# Patient Record
Sex: Female | Born: 1990 | Race: Black or African American | Hispanic: No | Marital: Single | Smoking: Never smoker
Health system: Southern US, Community
[De-identification: ages and names within clinical notes are randomized; demographics above are authoritative.]

## PROBLEM LIST (undated history)

## (undated) ENCOUNTER — Inpatient Hospital Stay (HOSPITAL_COMMUNITY): Payer: Self-pay

## (undated) DIAGNOSIS — Z8759 Personal history of other complications of pregnancy, childbirth and the puerperium: Secondary | ICD-10-CM

## (undated) HISTORY — PX: NO PAST SURGERIES: SHX2092

---

## 2013-09-20 ENCOUNTER — Emergency Department (INDEPENDENT_AMBULATORY_CARE_PROVIDER_SITE_OTHER)
Admission: EM | Admit: 2013-09-20 | Discharge: 2013-09-20 | Disposition: A | Discharge status: Home / Self Care | Payer: Self-pay | Source: Home / Self Care | Attending: Emergency Medicine | Admitting: Emergency Medicine

## 2013-09-20 ENCOUNTER — Encounter (HOSPITAL_COMMUNITY): Payer: Self-pay | Admitting: Emergency Medicine

## 2013-09-20 DIAGNOSIS — S39012A Strain of muscle, fascia and tendon of lower back, initial encounter: Secondary | ICD-10-CM

## 2013-09-20 DIAGNOSIS — S335XXA Sprain of ligaments of lumbar spine, initial encounter: Secondary | ICD-10-CM

## 2013-09-20 MED ORDER — METHOCARBAMOL 500 MG PO TABS: 500.0000 mg | ORAL_TABLET | Freq: Three times a day (TID) | ORAL | Status: DC

## 2013-09-20 MED ORDER — TRAMADOL HCL 50 MG PO TABS: 100.0000 mg | ORAL_TABLET | Freq: Three times a day (TID) | ORAL | Status: DC | PRN

## 2013-09-20 MED ORDER — MELOXICAM 15 MG PO TABS: 15.0000 mg | ORAL_TABLET | Freq: Every day | ORAL | Status: DC
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
## 2013-09-20 NOTE — Discharge Instructions (Signed)
Do exercises twice daily followed by moist heat for 15 minutes. ° ° ° ° ° °Try to be as active as possible. ° °If no better in 2 weeks, follow up with orthopedist. ° ° °

## 2014-03-12 ENCOUNTER — Ambulatory Visit (INDEPENDENT_AMBULATORY_CARE_PROVIDER_SITE_OTHER): Payer: PRIVATE HEALTH INSURANCE | Admitting: Family Medicine

## 2014-03-12 VITALS — BP 100/70 | HR 89 | Temp 98.0°F | Resp 16 | Ht 65.75 in | Wt 100.2 lb

## 2014-03-12 DIAGNOSIS — H1133 Conjunctival hemorrhage, bilateral: Secondary | ICD-10-CM

## 2014-03-12 DIAGNOSIS — H1130 Conjunctival hemorrhage, unspecified eye: Secondary | ICD-10-CM

## 2014-03-12 NOTE — Patient Instructions (Signed)
Subconjunctival Hemorrhage °A subconjunctival hemorrhage is a bright red patch covering a portion of the white of the eye. The white part of the eye is called the sclera, and it is covered by a thin membrane called the conjunctiva. This membrane is clear, except for tiny blood vessels that you can see with the naked eye. When your eye is irritated or inflamed and becomes red, it is because the vessels in the conjunctiva are swollen. °Sometimes, a blood vessel in the conjunctiva can break and bleed. When this occurs, the blood builds up between the conjunctiva and the sclera, and spreads out to create a red area. The red spot may be very small at first. It may then spread to cover a larger part of the surface of the eye, or even all of the visible white part of the eye. °In almost all cases, the blood will go away and the eye will become white again. Before completely dissolving, however, the red area may spread. It may also become brownish-yellow in color, before going away. If a lot of blood collects under the conjunctiva, it may look like a bulge on the surface of the eye. This looks scary, but it will also eventually flatten out and go away. Subconjunctival hemorrhages do not cause pain, but if swollen, may cause a feeling of irritation. There is no effect on vision.  °CAUSES  °· The most common cause is mild trauma (rubbing the eye, irritation). °· Subconjunctival hemorrhages can happen because of coughing or straining (lifting heavy objects), vomiting, or sneezing. °· In some cases, your doctor may want to check your blood pressure. High blood pressure can also cause a sunconjunctival hemorrhage. °· Severe trauma or blunt injuries. °· Diseases that affect blood clotting (hemophilia, leukemia). °· Abnormalities of blood vessels behind the eye (carotid cavernous sinus fistula). °· Tumors behind the eye. °· Certain drugs (aspirin, coumadin, heparin). °· Recent eye surgery. °HOME CARE INSTRUCTIONS  °· Do not worry  about the appearance of your eye. You may continue your usual activities. °· Often, follow-up is not necessary. °SEEK MEDICAL CARE IF:  °· Your eye becomes painful. °· The bleeding does not disappear within 3 weeks. °· Bleeding occurs elsewhere, for example, under the skin, in the mouth, or in the other eye. °· You have recurring subconjunctival hemorrhages. °SEEK IMMEDIATE MEDICAL CARE IF:  °· Your vision changes or you have difficulty seeing. °· You develop severe headache, persistent vomiting, confusion, or abnormal drowsiness (lethargy). °· Your eye seems to bulge or protrude from the eye socket. °· You notice the sudden appearance of bruises, or have spontaneous bleeding elsewhere on your body. °Document Released: 08/10/2005 Document Revised: 11/02/2011 Document Reviewed: 07/08/2009 °ExitCare® Patient Information ©2015 [employer]. This information is not intended to replace advice given to you by your health care provider. Make sure you discuss any questions you have with your health care provider. ° °

## 2014-03-12 NOTE — Progress Notes (Signed)
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 She developed some conjunctival hemorrhages in both eyes.  Patient has no change in vision, no itching. She does not use contacts  Objective: No acute distress Fundi normal Patient has bilateral small subconjunctival hemorrhages medial to each of her irises. Pupils are equal and reactive  Assessment: Bilateral subconjunctival hemorrhage after Valsalva.  Plan: Reassurance  Signed, Adin Lariccia M.D.

## 2015-07-25 ENCOUNTER — Encounter (HOSPITAL_COMMUNITY): Payer: Self-pay

## 2015-07-25 ENCOUNTER — Inpatient Hospital Stay (HOSPITAL_COMMUNITY)
Admission: AD | Admit: 2015-07-25 | Discharge: 2015-07-26 | Disposition: A | Discharge status: Home / Self Care | Payer: BLUE CROSS/BLUE SHIELD | Source: Ambulatory Visit | Attending: Family Medicine | Admitting: Family Medicine

## 2015-07-25 ENCOUNTER — Inpatient Hospital Stay (HOSPITAL_COMMUNITY): Payer: BLUE CROSS/BLUE SHIELD

## 2015-07-25 DIAGNOSIS — O209 Hemorrhage in early pregnancy, unspecified: Secondary | ICD-10-CM | POA: Diagnosis present

## 2015-07-25 DIAGNOSIS — O4691 Antepartum hemorrhage, unspecified, first trimester: Secondary | ICD-10-CM

## 2015-07-25 DIAGNOSIS — O3680X0 Pregnancy with inconclusive fetal viability, not applicable or unspecified: Secondary | ICD-10-CM

## 2015-07-25 DIAGNOSIS — O0281 Inappropriate change in quantitative human chorionic gonadotropin (hCG) in early pregnancy: Secondary | ICD-10-CM | POA: Diagnosis not present

## 2015-07-25 LAB — URINALYSIS, ROUTINE W REFLEX MICROSCOPIC
Glucose, UA: NEGATIVE mg/dL
Ketones, ur: NEGATIVE mg/dL
Leukocytes, UA: NEGATIVE
NITRITE: NEGATIVE
Protein, ur: 30 mg/dL — AB
Specific Gravity, Urine: >1.03 — ABNORMAL HIGH (ref 1.005–1.030)
pH: 5.5 (ref 5.0–8.0)

## 2015-07-25 LAB — CBC
HEMATOCRIT: 37.9 % (ref 36.0–46.0)
Hemoglobin: 12.6 g/dL (ref 12.0–15.0)
MCH: 29.6 pg (ref 26.0–34.0)
MCHC: 33.2 g/dL (ref 30.0–36.0)
MCV: 89.2 fL (ref 78.0–100.0)
Platelets: 341 10*3/uL (ref 150–400)
RBC: 4.25 MIL/uL (ref 3.87–5.11)
RDW: 13.3 % (ref 11.5–15.5)
WBC: 4.8 10*3/uL (ref 4.0–10.5)

## 2015-07-25 LAB — WET PREP, GENITAL
CLUE CELLS WET PREP: NONE SEEN
Sperm: NONE SEEN
TRICH WET PREP: NONE SEEN
YEAST WET PREP: NONE SEEN

## 2015-07-25 LAB — HCG, QUANTITATIVE, PREGNANCY: HCG, BETA CHAIN, QUANT, S: 614 m[IU]/mL — AB (ref <5)

## 2015-07-25 LAB — URINE MICROSCOPIC-ADD ON: WBC, UA: NONE SEEN WBC/hpf (ref 0–5)

## 2015-07-25 LAB — POCT PREGNANCY, URINE: PREG TEST UR: POSITIVE — AB

## 2015-07-25 NOTE — MAU Note (Signed)
Pt took pregnancy test in November-was positive. Started cramping and having vaginal bleeding that started 1 week ago-more when she wipes. LMP: 06/09/2015.

## 2015-07-25 NOTE — Discharge Instructions (Signed)

## 2015-07-25 NOTE — MAU Provider Note (Signed)
History     CSN: [id number]  Arrival date and time: 07/25/15 2216   First Provider Initiated Contact with Patient 07/25/15 2314      Chief Complaint  Patient presents with  . Abdominal Cramping  . Vaginal Bleeding   Vaginal Bleeding The patient's primary symptoms include vaginal bleeding. This is a new problem. The current episode started 1 to 4 weeks ago. The problem occurs constantly. The problem has been gradually worsening. The pain is moderate (4/10). The problem affects both sides. She is pregnant. Associated symptoms include abdominal pain. Pertinent negatives include no chills, constipation, diarrhea, dysuria, fever, frequency, nausea, urgency or vomiting. The vaginal discharge was bloody. The vaginal bleeding is lighter than menses. She has not been passing clots. She has not been passing tissue. Nothing aggravates the symptoms. She has tried nothing for the symptoms. She is sexually active. It is unknown whether or not her partner has an STD.     History reviewed. No pertinent past medical history.  History reviewed. No pertinent past surgical history.  Family History  Problem Relation Age of Onset  . Cancer Maternal Grandmother   . Diabetes Maternal Grandmother     Social History  Substance Use Topics  . Smoking status: Never Smoker   . Smokeless tobacco: Never Used  . Alcohol Use: No    Allergies: No Known Allergies  Prescriptions prior to admission  Medication Sig Dispense Refill Last Dose  . ethynodiol-ethinyl estradiol (KELNOR,ZOVIA) 1-35 MG-MCG tablet Take 1 tablet by mouth daily.   Taking    Review of Systems  Constitutional: Negative for fever and chills.  Gastrointestinal: Positive for abdominal pain. Negative for nausea, vomiting, diarrhea and constipation.  Genitourinary: Positive for vaginal bleeding. Negative for dysuria, urgency and frequency.   Physical Exam   Blood pressure 119/70, pulse 99, temperature 98.3 F (36.8 C), temperature source  Oral, resp. rate 18, height  (1.651 m), weight 46.811 kg (103 lb 3.2 oz), last menstrual period 06/09/2015, SpO2 100 %.  Physical Exam  Nursing note and vitals reviewed. Constitutional: She is oriented to person, place, and time. She appears well-developed and well-nourished. No distress.  HENT:  Head: Normocephalic.  Cardiovascular: Normal rate.   Respiratory: Effort normal.  GI: Soft. There is no tenderness. There is no rebound.  Neurological: She is alert and oriented to person, place, and time.  Skin: Skin is warm and dry.  Psychiatric: She has a normal mood and affect.   Results for orders placed or performed during the hospital encounter of 07/25/15 (from the past 24 hour(s))  Urinalysis, Routine w reflex microscopic (not at [facility])     Status: Abnormal   Collection Time: 07/25/15 10:28 PM  Result Value Ref Range   Color, Urine RED (A) YELLOW   APPearance HAZY (A) CLEAR   Specific Gravity, Urine >1.030 (H) 1.005 - 1.030   pH 5.5 5.0 - 8.0   Glucose, UA NEGATIVE NEGATIVE mg/dL   Hgb urine dipstick LARGE (A) NEGATIVE   Bilirubin Urine SMALL (A) NEGATIVE   Ketones, ur NEGATIVE NEGATIVE mg/dL   Protein, ur 30 (A) NEGATIVE mg/dL   Nitrite NEGATIVE NEGATIVE   Leukocytes, UA NEGATIVE NEGATIVE  Urine microscopic-add on     Status: Abnormal   Collection Time: 07/25/15 10:28 PM  Result Value Ref Range   Squamous Epithelial / LPF 0-5 (A) NONE SEEN   WBC, UA NONE SEEN 0 - 5 WBC/hpf   RBC / HPF TOO NUMEROUS TO COUNT 0 - 5 RBC/hpf  Bacteria, UA RARE (A) NONE SEEN   Urine-Other MUCOUS PRESENT   Pregnancy, urine POC     Status: Abnormal   Collection Time: 07/25/15 10:37 PM  Result Value Ref Range   Preg Test, Ur POSITIVE (A) NEGATIVE  CBC     Status: None   Collection Time: 07/25/15 10:49 PM  Result Value Ref Range   WBC 4.8 4.0 - 10.5 K/uL   RBC 4.25 3.87 - 5.11 MIL/uL   Hemoglobin 12.6 12.0 - 15.0 g/dL   HCT 16.137.9 09.636.0 - 04.546.0 %   MCV 89.2 78.0 - 100.0 fL   MCH 29.6 26.0 -  34.0 pg   MCHC 33.2 30.0 - 36.0 g/dL   RDW 40.913.3 81.111.5 - 91.415.5 %   Platelets 341 150 - 400 K/uL  ABO/Rh     Status: None (Preliminary result)   Collection Time: 07/25/15 10:49 PM  Result Value Ref Range   ABO/RH(D) B POS   hCG, quantitative, pregnancy     Status: Abnormal   Collection Time: 07/25/15 10:49 PM  Result Value Ref Range   hCG, Beta Chain, Quant, S 614 (H) <5 mIU/mL  Wet prep, genital     Status: Abnormal   Collection Time: 07/25/15 11:05 PM  Result Value Ref Range   Yeast Wet Prep HPF POC NONE SEEN NONE SEEN   Trich, Wet Prep NONE SEEN NONE SEEN   Clue Cells Wet Prep HPF POC NONE SEEN NONE SEEN   WBC, Wet Prep HPF POC FEW (A) NONE SEEN   Sperm NONE SEEN    [place] Ob Comp Less 14 Wks  07/25/2015  CLINICAL DATA:  Cramping and bleeding for 1 week EXAM: OBSTETRIC <14 WK US AND TRANSVAGINAL OB US TECHNIQUE: Both transabdominal and transvaginal ultrasound examinations were performed for complete evaluation of the gestation as well as the maternal uterus, adnexal regions, and pelvic cul-de-sac. Transvaginal technique was performed to assess early pregnancy. COMPARISON:  None. FINDINGS: Intrauterine gestational sac: None Yolk sac:  No Embryo:  No Cardiac Activity: No Heart Rate:   bpm MSD:   mm    w     d CRL:    mm    w    d                  US EDC: Maternal uterus/adnexae: Normal ovaries. Mildly complex fluid or blood in the cul-de-sac. IMPRESSION: No visible gestational sac. Small volume blood or fluid in the cul-de-sac. No adnexal mass or cyst. The findings are nonspecific and could represent early pregnancy failure, intrauterine gestation which is too small to visualize, ectopic gestation. Electronically Signed   By: Ellery Plunkaniel R Mitchell M.D.   On: 07/25/2015 23:52   [place] Ob Transvaginal  07/25/2015  CLINICAL DATA:  Cramping and bleeding for 1 week EXAM: OBSTETRIC <14 WK US AND TRANSVAGINAL OB US TECHNIQUE: Both transabdominal and transvaginal ultrasound examinations were performed for complete  evaluation of the gestation as well as the maternal uterus, adnexal regions, and pelvic cul-de-sac. Transvaginal technique was performed to assess early pregnancy. COMPARISON:  None. FINDINGS: Intrauterine gestational sac: None Yolk sac:  No Embryo:  No Cardiac Activity: No Heart Rate:   bpm MSD:   mm    w     d CRL:    mm    w    d                  US EDC: Maternal uterus/adnexae: Normal ovaries. Mildly complex fluid or blood  in the cul-de-sac. IMPRESSION: No visible gestational sac. Small volume blood or fluid in the cul-de-sac. No adnexal mass or cyst. The findings are nonspecific and could represent early pregnancy failure, intrauterine gestation which is too small to visualize, ectopic gestation. Electronically Signed   By: Ellery Plunk M.D.   On: 07/25/2015 23:52     MAU Course  Procedures  MDM  Assessment and Plan   1. Pregnancy of unknown anatomic location   2. Vaginal bleeding in pregnancy, first trimester    DC home Comfort measures reviewed  1st Trimester precautions  Bleeding precautions Ectopic precautions RX: none  Return to MAU in 48 hours   Follow-up Information    Follow up with THE [facility] [place] MATERNITY ADMISSIONS.   Why:  07/27/15 in the evening    Contact information:   [address] [id number] mc [place] [address] [phone]        Tawnya Crook 07/25/2015, 11:15 PM

## 2015-07-26 DIAGNOSIS — O209 Hemorrhage in early pregnancy, unspecified: Secondary | ICD-10-CM | POA: Diagnosis not present

## 2015-07-26 LAB — ABO/RH: ABO/RH(D): B POS

## 2015-07-26 LAB — RPR: RPR Ser Ql: NONREACTIVE

## 2015-07-26 LAB — GC/CHLAMYDIA PROBE AMP (~~LOC~~) NOT AT ARMC
Chlamydia: NEGATIVE
NEISSERIA GONORRHEA: NEGATIVE

## 2015-07-26 LAB — HIV ANTIBODY (ROUTINE TESTING W REFLEX): HIV Screen 4th Generation wRfx: NONREACTIVE

## 2015-07-27 ENCOUNTER — Inpatient Hospital Stay (HOSPITAL_COMMUNITY)
Admission: AD | Admit: 2015-07-27 | Discharge: 2015-07-27 | Disposition: A | Discharge status: Home / Self Care | Payer: BLUE CROSS/BLUE SHIELD | Source: Ambulatory Visit | Attending: Obstetrics & Gynecology | Admitting: Obstetrics & Gynecology

## 2015-07-27 DIAGNOSIS — O0281 Inappropriate change in quantitative human chorionic gonadotropin (hCG) in early pregnancy: Secondary | ICD-10-CM

## 2015-07-27 DIAGNOSIS — O3680X0 Pregnancy with inconclusive fetal viability, not applicable or unspecified: Secondary | ICD-10-CM

## 2015-07-27 LAB — HCG, QUANTITATIVE, PREGNANCY: HCG, BETA CHAIN, QUANT, S: 635 m[IU]/mL — AB (ref <5)

## 2015-07-27 NOTE — MAU Note (Signed)
Here for follow up labs. Denies pain and states vag bleeding has stopped

## 2015-07-27 NOTE — MAU Provider Note (Signed)
  History     CSN: [id number]  Arrival date and time: 07/27/15 2114   None     Chief Complaint  Patient presents with  . Follow-up   HPI Misty Mckenzie 24 y.o. G1P0 Pt presents for follow up lab work related to ectopic workup started 2 days ago.  She denies vaginal bleeding or abdominal pain since that time.   OB History    Gravida Para Term Preterm AB TAB SAB Ectopic Multiple Living   1               No past medical history on file.  No past surgical history on file.  Family History  Problem Relation Age of Onset  . Cancer Maternal Grandmother   . Diabetes Maternal Grandmother     Social History  Substance Use Topics  . Smoking status: Never Smoker   . Smokeless tobacco: Never Used  . Alcohol Use: No    Allergies: No Known Allergies  No prescriptions prior to admission    ROS Pertinent ROS in HPI.  All other systems are negative.   Physical Exam   Blood pressure 127/78, pulse 79, temperature 98.4 F (36.9 C), resp. rate 18, height 5\' 4"  (1.626 m), weight 105 lb (47.628 kg), last menstrual period 06/09/2015.  Physical Exam  Constitutional: She is oriented to person, place, and time. She appears well-developed and well-nourished. No distress.  HENT:  Head: Normocephalic and atraumatic.  Eyes: Conjunctivae and EOM are normal.  Respiratory: Effort normal. No respiratory distress.  Neurological: She is alert and oriented to person, place, and time.  Psychiatric: She has a normal mood and affect. Her behavior is normal.    MAU Course  Procedures  MDM HCG 2 days ago: 614 HCG today: 635  Assessment and Plan  A:  1. Pregnancy of unknown anatomic location   2. Inappropriate change in quantitative hCG in early pregnancy    P: Discharge to home Too early to call miscarriage.  Pt desiring pregnancy.   Reasonable expectations addressed Pt to return to MAU in 48 hours for repeat quant HCG Ectopic precautions discussed. Come right away to MAU for heavy  bleeding, severe pain/etc Patient may return to MAU as needed or if her condition were to change or worsen   Bertram DenverKaren E Teague Clark 07/27/2015, 10:21 PM

## 2015-07-27 NOTE — MAU Note (Signed)
Misty MaclachlanKaren Teague-Clark PA in Triage to discuss test results and d/c plan. D/C home from Triage. Will return in 2 days for repeat labs

## 2015-07-29 ENCOUNTER — Inpatient Hospital Stay (HOSPITAL_COMMUNITY)
Admission: AD | Admit: 2015-07-29 | Discharge: 2015-07-30 | Disposition: A | Discharge status: Home / Self Care | Payer: BLUE CROSS/BLUE SHIELD | Source: Ambulatory Visit | Attending: Obstetrics & Gynecology | Admitting: Obstetrics & Gynecology

## 2015-07-29 ENCOUNTER — Other Ambulatory Visit: Payer: Self-pay | Admitting: Obstetrics and Gynecology

## 2015-07-29 DIAGNOSIS — O209 Hemorrhage in early pregnancy, unspecified: Secondary | ICD-10-CM | POA: Diagnosis present

## 2015-07-29 DIAGNOSIS — O3680X0 Pregnancy with inconclusive fetal viability, not applicable or unspecified: Secondary | ICD-10-CM

## 2015-07-29 DIAGNOSIS — O0281 Inappropriate change in quantitative human chorionic gonadotropin (hCG) in early pregnancy: Secondary | ICD-10-CM | POA: Diagnosis not present

## 2015-07-29 DIAGNOSIS — O26851 Spotting complicating pregnancy, first trimester: Secondary | ICD-10-CM | POA: Insufficient documentation

## 2015-07-29 DIAGNOSIS — Z3A01 Less than 8 weeks gestation of pregnancy: Secondary | ICD-10-CM | POA: Insufficient documentation

## 2015-07-29 LAB — HCG, QUANTITATIVE, PREGNANCY: hCG, Beta Chain, Quant, S: 941 m[IU]/mL — ABNORMAL HIGH (ref <5)

## 2015-07-29 NOTE — MAU Note (Signed)
PT  SAYS  SHE  WAS HERE   ON  SAT-     .   ON  SAT- NO PAIN -  NOW  NO PAIN.       ON  SAT -  SPOTTING.   NOW   LIGHT BROWN  SPOTTING  ON PAD.

## 2015-07-30 ENCOUNTER — Inpatient Hospital Stay (HOSPITAL_COMMUNITY): Payer: BLUE CROSS/BLUE SHIELD

## 2015-07-30 DIAGNOSIS — O0281 Inappropriate change in quantitative human chorionic gonadotropin (hCG) in early pregnancy: Secondary | ICD-10-CM | POA: Diagnosis not present

## 2015-07-30 NOTE — Discharge Instructions (Signed)

## 2015-07-30 NOTE — MAU Provider Note (Signed)
History     CSN: [id number]  Arrival date and time: 07/29/15 2246   First Provider Initiated Contact with Patient 07/29/15 2351      No chief complaint on file.  HPI Comments: Misty Mckenzie is a 24 y.o. G1P0 at 9260w2d who presents today for FU HCG. She has been followed with HCGs that were not rising as expected. She denies any abdominal pain today. She had spotting on Saturday, and today has had brown spotting. She states that the bleeding is less than Saturday.   Vaginal Bleeding The patient's primary symptoms include vaginal discharge. This is a new problem. The current episode started in the past 7 days. The problem occurs intermittently. The problem has been unchanged. The patient is experiencing no pain. She is pregnant. The vaginal discharge was brown. The vaginal bleeding is spotting. She has not been passing clots. She has not been passing tissue. Nothing aggravates the symptoms. She has tried nothing for the symptoms.    No past medical history on file.  No past surgical history on file.  Family History  Problem Relation Age of Onset  . Cancer Maternal Grandmother   . Diabetes Maternal Grandmother     Social History  Substance Use Topics  . Smoking status: Never Smoker   . Smokeless tobacco: Never Used  . Alcohol Use: No    Allergies: No Known Allergies  No prescriptions prior to admission    Review of Systems  Genitourinary: Positive for vaginal bleeding and vaginal discharge.   Physical Exam   Blood pressure 122/79, pulse 101, temperature 98.4 F (36.9 C), temperature source Oral, resp. rate 20, last menstrual period 06/09/2015.  Physical Exam  Nursing note and vitals reviewed. Constitutional: She is oriented to person, place, and time. She appears well-developed and well-nourished. No distress.  HENT:  Head: Normocephalic.  Cardiovascular: Normal rate.   Respiratory: Effort normal.  GI: Soft. There is no tenderness.  Neurological: She is alert and  oriented to person, place, and time.  Skin: Skin is warm and dry.  Psychiatric: She has a normal mood and affect.    Results for Misty LoftyHILLIPS, Brynlei (MRN [id number]) as of 07/30/2015 00:53  Ref. Range 07/25/2015 22:49 07/25/2015 23:05 07/25/2015 23:39 07/27/2015 21:25 07/29/2015 22:55  HCG, Beta Chain, Quant, S Latest Ref Range: <5 mIU/mL 614 (H)   635 (H) 941 (H)   Results for orders placed or performed during the hospital encounter of 07/29/15 (from the past 24 hour(s))  hCG, quantitative, pregnancy     Status: Abnormal   Collection Time: 07/29/15 10:55 PM  Result Value Ref Range   hCG, Beta Chain, Quant, S 941 (H) <5 mIU/mL   [place] Ob Transvaginal  07/30/2015  CLINICAL DATA:  24 year old female with positive pregnancy test EXAM: OBSTETRIC <14 WK US AND TRANSVAGINAL OB US TECHNIQUE: Transvaginal ultrasound examinations were performed for evaluation of the gestation as well as the maternal uterus, adnexal regions, and pelvic cul-de-sac. Transvaginal technique was performed to assess early pregnancy. COMPARISON:  Ultrasound dated 07/25/2015 FINDINGS: The uterus is anteverted appears unremarkable. No intrauterine pregnancy identified. The gestational age based on LMP is 7 weeks, 2 days. Findings likely represent spontaneous abortion. However, the possibility of an ectopic pregnancy is not entirely excluded. Correlation with serial HCG levels and follow-up ultrasound is recommended. The right ovary measures 2.5 x 2.7 x 1.9 cm and the left ovary measures 4.0 x 2.4 cm. A a cystic structure within the left ovary most compatible with a corpus luteum. No significant  free fluid the pelvis. IMPRESSION: No intrauterine pregnancy identified. Correlation with clinical exam and follow-up with serial HCG levels and ultrasound recommended. Electronically Signed   By: Elgie Collard M.D.   On: 07/30/2015 00:49    MAU Course  Procedures  MDM 0058: D/W Dr. Debroah Loop. Repeat HCG IN 48 hours.    Assessment and Plan   1.  Pregnancy, location unknown    DC home Comfort measures reviewed  1st Trimester precautions  Bleeding precautions Ectopic precautions RX: none  Return to MAU as needed FU with OB as planned  Follow-up Information    Follow up with THE [facility] [place] MATERNITY ADMISSIONS In 2 days.   Why:  FOR REPEAT BLOOD WORK    Contact information:   [address] [id number] mc [place] [address] [phone]        Tawnya Crook 07/30/2015, 12:54 AM

## 2015-08-01 ENCOUNTER — Inpatient Hospital Stay (HOSPITAL_COMMUNITY)
Admission: AD | Admit: 2015-08-01 | Discharge: 2015-08-01 | Disposition: A | Discharge status: Home / Self Care | Payer: BLUE CROSS/BLUE SHIELD | Source: Ambulatory Visit | Attending: Obstetrics and Gynecology | Admitting: Obstetrics and Gynecology

## 2015-08-01 DIAGNOSIS — O009 Unspecified ectopic pregnancy without intrauterine pregnancy: Secondary | ICD-10-CM | POA: Diagnosis present

## 2015-08-01 LAB — COMPREHENSIVE METABOLIC PANEL
ALBUMIN: 4.1 g/dL (ref 3.5–5.0)
ALK PHOS: 51 U/L (ref 38–126)
ALT: 11 U/L — AB (ref 14–54)
AST: 20 U/L (ref 15–41)
Anion gap: 7 (ref 5–15)
BILIRUBIN TOTAL: 0.8 mg/dL (ref 0.3–1.2)
BUN: 14 mg/dL (ref 6–20)
CALCIUM: 8.7 mg/dL — AB (ref 8.9–10.3)
CO2: 26 mmol/L (ref 22–32)
CREATININE: 0.65 mg/dL (ref 0.44–1.00)
Chloride: 104 mmol/L (ref 101–111)
GFR calc Af Amer: >60 mL/min (ref >60)
GFR calc non Af Amer: >60 mL/min (ref >60)
Glucose, Bld: 89 mg/dL (ref 65–99)
Potassium: 3.7 mmol/L (ref 3.5–5.1)
SODIUM: 137 mmol/L (ref 135–145)
TOTAL PROTEIN: 7.7 g/dL (ref 6.5–8.1)

## 2015-08-01 LAB — CBC
HEMATOCRIT: 35.7 % — AB (ref 36.0–46.0)
HEMOGLOBIN: 11.9 g/dL — AB (ref 12.0–15.0)
MCH: 29.4 pg (ref 26.0–34.0)
MCHC: 33.3 g/dL (ref 30.0–36.0)
MCV: 88.1 fL (ref 78.0–100.0)
Platelets: 336 10*3/uL (ref 150–400)
RBC: 4.05 MIL/uL (ref 3.87–5.11)
RDW: 13 % (ref 11.5–15.5)
WBC: 3.9 10*3/uL — ABNORMAL LOW (ref 4.0–10.5)

## 2015-08-01 LAB — HCG, QUANTITATIVE, PREGNANCY: HCG, BETA CHAIN, QUANT, S: 961 m[IU]/mL — AB (ref <5)

## 2015-08-01 MED ORDER — METHOTREXATE INJECTION FOR WOMEN'S HOSPITAL
50.0000 mg/m2 | Freq: Once | INTRAMUSCULAR | Status: AC
  Administered 2015-08-01: 75 mg via INTRAMUSCULAR
  Filled 2015-08-01: qty 1.5

## 2015-08-01 NOTE — MAU Note (Signed)
Pt her for f/u HHCG. Denies andy bleeding had some cramping last  Night none this morning.

## 2015-08-01 NOTE — MAU Provider Note (Signed)
History   Chief Complaint:  Follow-up   Misty Mckenzie is  24 y.o. G1P0 Patient's last menstrual period was 06/09/2015.Marland Kitchen. Patient is here for follow up of quantitative HCG and ongoing surveillance of pregnancy status.   She is 759w4d weeks gestation  by LMP.    Since her last visit, the patient is with new complaint.     Pt her for f/u HHCG. Denies andy bleeding had some cramping last Night none this morning  ROS Abdomin Pain: None now. Mild cramping last night. Vaginal bleeding: none now.   Passage of clots or tissue: None Dizziness: None  Her previous Quantitative HCG values are:  Results for Misty Mckenzie (MRN [id number]) as of 08/01/2015 11:32  Ref. Range 07/25/2015 22:49 07/27/2015 21:25 07/29/2015 22:55  HCG, Beta Chain, Quant, S Latest Ref Range: <5 mIU/mL 614 (H) 635 (H) 941 (H)    Physical Exam   BP 123/75 mmHg  Pulse 91  Temp(Src) 97.7 F (36.5 C)  Resp 18  LMP 06/09/2015 Constitutional: Well-nourished female in no apparent distress. No pallor Neuro: Alert and oriented 4 Cardiovascular: Normal rate Respiratory: Normal effort and rate Abdomen: Soft, nontender Gynecological Exam: examination not indicated  Labs: Results for orders placed or performed during the hospital encounter of 08/01/15 (from the past 24 hour(s))  hCG, quantitative, pregnancy   Collection Time: 08/01/15  8:27 AM  Result Value Ref Range   hCG, Beta Chain, Quant, S 961 (H) <5 mIU/mL    Ultrasound Studies:   [place] Ob Comp Less 14 Wks  07/25/2015  CLINICAL DATA:  Cramping and bleeding for 1 week EXAM: OBSTETRIC <14 WK US AND TRANSVAGINAL OB US TECHNIQUE: Both transabdominal and transvaginal ultrasound examinations were performed for complete evaluation of the gestation as well as the maternal uterus, adnexal regions, and pelvic cul-de-sac. Transvaginal technique was performed to assess early pregnancy. COMPARISON:  None. FINDINGS: Intrauterine gestational sac: None Yolk sac:  No Embryo:  No Cardiac  Activity: No Heart Rate:   bpm MSD:   mm    w     d CRL:    mm    w    d                  US EDC: Maternal uterus/adnexae: Normal ovaries. Mildly complex fluid or blood in the cul-de-sac. IMPRESSION: No visible gestational sac. Small volume blood or fluid in the cul-de-sac. No adnexal mass or cyst. The findings are nonspecific and could represent early pregnancy failure, intrauterine gestation which is too small to visualize, ectopic gestation. Electronically Signed   By: Ellery Plunkaniel R Mitchell M.D.   On: 07/25/2015 23:52   [place] Ob Transvaginal  07/30/2015  CLINICAL DATA:  24 year old female with positive pregnancy test EXAM: OBSTETRIC <14 WK US AND TRANSVAGINAL OB US TECHNIQUE: Transvaginal ultrasound examinations were performed for evaluation of the gestation as well as the maternal uterus, adnexal regions, and pelvic cul-de-sac. Transvaginal technique was performed to assess early pregnancy. COMPARISON:  Ultrasound dated 07/25/2015 FINDINGS: The uterus is anteverted appears unremarkable. No intrauterine pregnancy identified. The gestational age based on LMP is 7 weeks, 2 days. Findings likely represent spontaneous abortion. However, the possibility of an ectopic pregnancy is not entirely excluded. Correlation with serial HCG levels and follow-up ultrasound is recommended. The right ovary measures 2.5 x 2.7 x 1.9 cm and the left ovary measures 4.0 x 2.4 cm. A a cystic structure within the left ovary most compatible with a corpus luteum. No significant free fluid the pelvis. IMPRESSION:  No intrauterine pregnancy identified. Correlation with clinical exam and follow-up with serial HCG levels and ultrasound recommended. Electronically Signed   By: Elgie Collard M.D.   On: 07/30/2015 00:49   US Ob Transvaginal  07/25/2015  CLINICAL DATA:  Cramping and bleeding for 1 week EXAM: OBSTETRIC <14 WK [place] AND TRANSVAGINAL OB US TECHNIQUE: Both transabdominal and transvaginal ultrasound examinations were performed for  complete evaluation of the gestation as well as the maternal uterus, adnexal regions, and pelvic cul-de-sac. Transvaginal technique was performed to assess early pregnancy. COMPARISON:  None. FINDINGS: Intrauterine gestational sac: None Yolk sac:  No Embryo:  No Cardiac Activity: No Heart Rate:   bpm MSD:   mm    w     d CRL:    mm    w    d                  [place] EDC: Maternal uterus/adnexae: Normal ovaries. Mildly complex fluid or blood in the cul-de-sac. IMPRESSION: No visible gestational sac. Small volume blood or fluid in the cul-de-sac. No adnexal mass or cyst. The findings are nonspecific and could represent early pregnancy failure, intrauterine gestation which is too small to visualize, ectopic gestation. Electronically Signed   By: Ellery Plunk M.D.   On: 07/25/2015 23:52    MAU course/MDM: Quantitative hCG ordered  Pain and bleeding in early pregnancy with abnormal rise in Quant 3, but hemodynamically stable. Suspect ectopic pregnancy. Low suspicion for rupture due to absence of pain and no significant free fluid on ultrasound 2 days ago. Discussed diagnosis with patient and recommend methotrexate. Patient agrees. CMP and CBC ordered. Methotrexate given.  Assessment: Ectopic pregnancy treated with methotrexate.  Plan: Discharge home in stable condition. Ectopic precautions Instructed to avoid folic acid supplements or NSAIDs 1 week. Support given. Pregnancy loss information given Follow-up Information    Follow up with [facility] On 08/04/2015.   Why:  For repeat blood work or sooner as needed for worsening abdominal pain.   Contact information:   [address] [id number] mc [place] [address] [phone]        Medication List    Notice    You have not been prescribed any medications.      Dorathy Kinsman, CNM 08/01/2015, 10:08 AM  2/3

## 2015-08-01 NOTE — Discharge Instructions (Signed)
Ectopic Pregnancy °An ectopic pregnancy is when the fertilized egg attaches (implants) outside the uterus. Most ectopic pregnancies occur in the fallopian tube. Rarely do ectopic pregnancies occur on the ovary, intestine, pelvis, or cervix. In an ectopic pregnancy, the fertilized egg does not have the ability to develop into a normal, healthy baby.  °A ruptured ectopic pregnancy is one in which the fallopian tube gets torn or bursts and results in internal bleeding. Often there is intense abdominal pain, and sometimes, vaginal bleeding. Having an ectopic pregnancy can be life threatening. If left untreated, this dangerous condition can lead to a blood transfusion, abdominal surgery, or even death. °CAUSES  °Damage to the fallopian tubes is the suspected cause in most ectopic pregnancies.  °RISK FACTORS °Depending on your circumstances, the risk of having an ectopic pregnancy will vary. The level of risk can be divided into three categories. °High Risk °· You have gone through infertility treatment. °· You have had a previous ectopic pregnancy. °· You have had previous tubal surgery. °· You have had previous surgery to have the fallopian tubes tied (tubal ligation). °· You have tubal problems or diseases. °· You have been exposed to DES. DES is a medicine that was used until 1971 and had effects on babies whose mothers took the medicine. °· You become pregnant while using an intrauterine device (IUD) for birth control.  °Moderate Risk °· You have a history of infertility. °· You have a history of a sexually transmitted infection (STI). °· You have a history of pelvic inflammatory disease (PID). °· You have scarring from endometriosis. °· You have multiple sexual partners. °· You smoke.  °Low Risk °· You have had previous pelvic surgery. °· You use vaginal douching. °· You became sexually active before 23 years of age. °SIGNS AND SYMPTOMS  °An ectopic pregnancy should be suspected in anyone who has missed a period and  has abdominal pain or bleeding. °· You may experience normal pregnancy symptoms, such as: °· Nausea. °· Tiredness. °· Breast tenderness. °· Other symptoms may include: °· Pain with intercourse. °· Irregular vaginal bleeding or spotting. °· Cramping or pain on one side or in the lower abdomen. °· Fast heartbeat. °· Passing out while having a bowel movement. °· Symptoms of a ruptured ectopic pregnancy and internal bleeding may include: °· Sudden, severe pain in the abdomen and pelvis. °· Dizziness or fainting. °· Pain in the shoulder area. °DIAGNOSIS  °Tests that may be performed include: °· A pregnancy test. °· An ultrasound test. °· Testing the specific level of pregnancy hormone in the bloodstream. °· Taking a sample of uterus tissue (dilation and curettage, D&C). °· Surgery to perform a visual exam of the inside of the abdomen using a thin, lighted tube with a tiny camera on the end (laparoscope). °TREATMENT  °An injection of a medicine called methotrexate may be given. This medicine causes the pregnancy tissue to be absorbed. It is given if: °· The diagnosis is made early. °· The fallopian tube has not ruptured. °· You are considered to be a good candidate for the medicine. °Usually, pregnancy hormone blood levels are checked after methotrexate treatment. This is to be sure the medicine is effective. It may take 4-6 weeks for the pregnancy to be absorbed (though most pregnancies will be absorbed by 3 weeks). °Surgical treatment may be needed. A laparoscope may be used to remove the pregnancy tissue. If severe internal bleeding occurs, a cut (incision) may be made in the lower abdomen (laparotomy), and the ectopic   pregnancy is removed. This stops the bleeding. Part of the fallopian tube, or the whole tube, may be removed as well (salpingectomy). After surgery, pregnancy hormone tests may be done to be sure there is no pregnancy tissue left. You may receive a Rho (D) immune globulin shot if you are Rh negative and  the father is Rh positive, or if you do not know the Rh type of the father. This is to prevent problems with any future pregnancy. °SEEK IMMEDIATE MEDICAL CARE IF:  °You have any symptoms of an ectopic pregnancy. This is a medical emergency. °MAKE SURE YOU: °· Understand these instructions. °· Will watch your condition. °· Will get help right away if you are not doing well or get worse. °  °This information is not intended to replace advice given to you by your health care provider. Make sure you discuss any questions you have with your health care provider. °  °Document Released: 09/17/2004 Document Revised: 08/31/2014 Document Reviewed: 03/09/2013 °Elsevier Interactive Patient Education ©2016 [employer] Inc[employer] ° °Methotrexate Treatment for an Ectopic Pregnancy, Care After °Refer to this sheet in the next few weeks. These instructions provide you with information on caring for yourself after your procedure. Your health care provider may also give you more specific instructions. Your treatment has been planned according to current medical practices, but problems sometimes occur. Call your health care provider if you have any problems or questions after your procedure. °WHAT TO EXPECT AFTER THE PROCEDURE °You may have some abdominal cramping, vaginal bleeding, and fatigue in the first few days after taking methotrexate. Some other possible side effects of methotrexate include: °· Nausea. °· Vomiting. °· Diarrhea. °· Mouth sores. °· Swelling or irritation of the lining of your lungs (pneumonitis). °· Liver damage. °· Hair loss. °HOME CARE INSTRUCTIONS  °After you have received the methotrexate medicine, you need to be careful of your activities and watch your condition for several weeks. It may take 1 week before your hormone levels return to normal. °· Keep all follow-up appointments as directed by your health care provider. °· Avoid traveling too far away from your health care provider. °· Do not have sexual intercourse  until your health care provider says it is safe to do so. °· You may resume your usual diet. °· Limit strenuous activity. °· Do not take folic acid, prenatal vitamins, or other vitamins that contain folic acid. °· Do not take aspirin, ibuprofen, or naproxen (nonsteroidal anti-inflammatory drugs [NSAIDs]). °· Do not drink alcohol. °SEEK MEDICAL CARE IF:  °· You cannot control your nausea and vomiting. °· You cannot control your diarrhea. °· You have sores in your mouth and want treatment. °· You need pain medicine for your abdominal pain. °· You have a rash. °· You are having a reaction to the medicine. °SEEK IMMEDIATE MEDICAL CARE IF:  °· You have increasing abdominal or pelvic pain. °· You notice increased bleeding. °· You feel light-headed, or you faint. °· You have shortness of breath. °· Your heart rate increases. °· You have a cough. °· You have chills. °· You have a fever. °  °This information is not intended to replace advice given to you by your health care provider. Make sure you discuss any questions you have with your health care provider. °  °Document Released: 07/30/2011 Document Revised: 08/15/2013 Document Reviewed: 05/29/2013 °Elsevier Interactive Patient Education ©2016 Elsevier Inc. ° °

## 2015-08-04 ENCOUNTER — Inpatient Hospital Stay (HOSPITAL_COMMUNITY)
Admission: AD | Admit: 2015-08-04 | Discharge: 2015-08-04 | Disposition: A | Discharge status: Home / Self Care | Payer: PRIVATE HEALTH INSURANCE | Source: Ambulatory Visit | Attending: Obstetrics & Gynecology | Admitting: Obstetrics & Gynecology

## 2015-08-04 DIAGNOSIS — O001 Tubal pregnancy without intrauterine pregnancy: Secondary | ICD-10-CM | POA: Diagnosis not present

## 2015-08-04 DIAGNOSIS — O009 Unspecified ectopic pregnancy without intrauterine pregnancy: Secondary | ICD-10-CM

## 2015-08-04 LAB — HCG, QUANTITATIVE, PREGNANCY: hCG, Beta Chain, Quant, S: 1139 m[IU]/mL — ABNORMAL HIGH (ref <5)

## 2015-08-04 NOTE — MAU Provider Note (Signed)
Chief Complaint  Patient presents with  . Follow-up    Subjective:   Pt is a 24 y.o. G1P0 here for follow-up BHCG.  Upon review of the records patient was first seen on 07-25-15 for vaginal bleeding.   BHCG on that day was 614.  Ultrasound showed no IUP.  GC/CT and wet prep were collected.  Results were negative.   Pt followed by quants and then had methotrexate on 08-01-15 with quant of 961.   Pt here today with no report of abdominal pain or vaginal bleeding.   All other systems negative.    No past medical history on file.  OB History  Gravida Para Term Preterm AB SAB TAB Ectopic Multiple Living  1             # Outcome Date GA Lbr Len/2nd Weight Sex Delivery Anes PTL Lv  1 Current               Family History  Problem Relation Age of Onset  . Cancer Maternal Grandmother   . Diabetes Maternal Grandmother     Objective: Physical Exam  Filed Vitals:   08/04/15 1229  BP: 111/72  Pulse: 82  Temp: 98 F (36.7 C)  Resp: 16   Constitutional: She is oriented to person, place, and time. She appears well-developed and well-nourished. No distress.  Pulmonary/Chest: Effort normal. No respiratory distress.  Musculoskeletal: Normal range of motion.  Neurological: She is alert and oriented to person, place, and time.  Skin: Skin is warm and dry.    Assessment: 24 y.o. G1P0 at 2658w0d wks Pregnancy Follow-up BHCG  Plan: Quant rose today on Day 4 to 1139 Reviewed with client. Continue strict ectopic precautions. Return on Wednesday at 2 pm for repeat labs.  Will go to work early on Wed AM and will come after work (before she goes to her second job).  Discussed the possibility of continued increasing quant and the need for a second dose of methotrexate.  Client wants to have lab work done and go to her second job.  If quant is increasing, she knows she will have to come back to MAU.   Advised to come back sooner if she has severe bleeding or severe abdominal pain.

## 2015-08-04 NOTE — MAU Note (Signed)
Pt here for F/U BHCG today.  Received MTX on 12/8.  Denies pain or bleeding.

## 2015-08-07 ENCOUNTER — Inpatient Hospital Stay (HOSPITAL_COMMUNITY)
Admission: AD | Admit: 2015-08-07 | Discharge: 2015-08-07 | Disposition: A | Discharge status: Home / Self Care | Payer: Medicaid Other | Source: Ambulatory Visit | Attending: Obstetrics & Gynecology | Admitting: Obstetrics & Gynecology

## 2015-08-07 DIAGNOSIS — O0000 Abdominal pregnancy without intrauterine pregnancy: Secondary | ICD-10-CM | POA: Diagnosis not present

## 2015-08-07 DIAGNOSIS — Z3A08 8 weeks gestation of pregnancy: Secondary | ICD-10-CM | POA: Insufficient documentation

## 2015-08-07 DIAGNOSIS — O009 Unspecified ectopic pregnancy without intrauterine pregnancy: Secondary | ICD-10-CM | POA: Diagnosis present

## 2015-08-07 LAB — COMPREHENSIVE METABOLIC PANEL
ALK PHOS: 49 U/L (ref 38–126)
ALT: 18 U/L (ref 14–54)
ANION GAP: 5 (ref 5–15)
AST: 26 U/L (ref 15–41)
Albumin: 4 g/dL (ref 3.5–5.0)
BILIRUBIN TOTAL: 0.4 mg/dL (ref 0.3–1.2)
BUN: 12 mg/dL (ref 6–20)
CALCIUM: 9.1 mg/dL (ref 8.9–10.3)
CO2: 28 mmol/L (ref 22–32)
CREATININE: 0.71 mg/dL (ref 0.44–1.00)
Chloride: 105 mmol/L (ref 101–111)
Glucose, Bld: 86 mg/dL (ref 65–99)
Potassium: 4 mmol/L (ref 3.5–5.1)
SODIUM: 138 mmol/L (ref 135–145)
TOTAL PROTEIN: 7.3 g/dL (ref 6.5–8.1)

## 2015-08-07 LAB — CBC
HEMATOCRIT: 35 % — AB (ref 36.0–46.0)
HEMOGLOBIN: 11.5 g/dL — AB (ref 12.0–15.0)
MCH: 29.1 pg (ref 26.0–34.0)
MCHC: 32.9 g/dL (ref 30.0–36.0)
MCV: 88.6 fL (ref 78.0–100.0)
Platelets: 310 10*3/uL (ref 150–400)
RBC: 3.95 MIL/uL (ref 3.87–5.11)
RDW: 13.1 % (ref 11.5–15.5)
WBC: 3.8 10*3/uL — ABNORMAL LOW (ref 4.0–10.5)

## 2015-08-07 LAB — HCG, QUANTITATIVE, PREGNANCY: hCG, Beta Chain, Quant, S: 1082 m[IU]/mL — ABNORMAL HIGH (ref <5)

## 2015-08-07 MED ORDER — METHOTREXATE INJECTION FOR WOMEN'S HOSPITAL
50.0000 mg/m2 | Freq: Once | INTRAMUSCULAR | Status: AC
  Administered 2015-08-07: 75 mg via INTRAMUSCULAR
  Filled 2015-08-07: qty 1.5

## 2015-08-07 NOTE — MAU Note (Signed)
Pt here for F/U BHCG, post MTX.  Denies pain or bleeding.

## 2015-08-07 NOTE — Discharge Instructions (Signed)
Ectopic Pregnancy °An ectopic pregnancy happens when a fertilized egg grows outside the uterus. A pregnancy cannot live outside of the uterus. This problem often happens in the fallopian tube. It is often caused by damage to the fallopian tube. °If this problem is found early, you may be treated with medicine. If your tube tears or bursts open (ruptures), you will bleed inside. This is an emergency. You will need surgery. Get help right away.  °SYMPTOMS °You may have normal pregnancy symptoms at first. These include: °· Missing your period. °· Feeling sick to your stomach (nauseous). °· Being tired. °· Having tender breasts. °Then, you may start to have symptoms that are not normal. These include: °· Pain with sex (intercourse). °· Bleeding from the vagina. This includes light bleeding (spotting). °· Belly (abdomen) or lower belly cramping or pain. This may be felt on one side. °· A fast heartbeat (pulse). °· Passing out (fainting) after going poop (bowel movement). °If your tube tears, you may have symptoms such as: °· Really bad pain in the belly or lower belly. This happens suddenly. °· Dizziness. °· Passing out. °· Shoulder pain. °GET HELP RIGHT AWAY IF:  °You have any of these symptoms. This is an emergency. °MAKE SURE YOU: °· Understand these instructions. °· Will watch your condition. °· Will get help right away if you are not doing well or get worse. °  °This information is not intended to replace advice given to you by your health care provider. Make sure you discuss any questions you have with your health care provider. °  °Document Released: 11/06/2008 Document Revised: 08/15/2013 Document Reviewed: 03/22/2013 °Elsevier Interactive Patient Education ©2016 [employer] ° °Methotrexate Treatment for an Ectopic Pregnancy, Care After °Refer to this sheet in the next few weeks. These instructions provide you with information on caring for yourself after your procedure. Your health care provider may also give  you more specific instructions. Your treatment has been planned according to current medical practices, but problems sometimes occur. Call your health care provider if you have any problems or questions after your procedure. °WHAT TO EXPECT AFTER THE PROCEDURE °You may have some abdominal cramping, vaginal bleeding, and fatigue in the first few days after taking methotrexate. Some other possible side effects of methotrexate include: °· Nausea. °· Vomiting. °· Diarrhea. °· Mouth sores. °· Swelling or irritation of the lining of your lungs (pneumonitis). °· Liver damage. °· Hair loss. °HOME CARE INSTRUCTIONS  °After you have received the methotrexate medicine, you need to be careful of your activities and watch your condition for several weeks. It may take 1 week before your hormone levels return to normal. °· Keep all follow-up appointments as directed by your health care provider. °· Avoid traveling too far away from your health care provider. °· Do not have sexual intercourse until your health care provider says it is safe to do so. °· You may resume your usual diet. °· Limit strenuous activity. °· Do not take folic acid, prenatal vitamins, or other vitamins that contain folic acid. °· Do not take aspirin, ibuprofen, or naproxen (nonsteroidal anti-inflammatory drugs [NSAIDs]). °· Do not drink alcohol. °SEEK MEDICAL CARE IF:  °· You cannot control your nausea and vomiting. °· You cannot control your diarrhea. °· You have sores in your mouth and want treatment. °· You need pain medicine for your abdominal pain. °· You have a rash. °· You are having a reaction to the medicine. °SEEK IMMEDIATE MEDICAL CARE IF:  °· You have   increasing abdominal or pelvic pain. °· You notice increased bleeding. °· You feel light-headed, or you faint. °· You have shortness of breath. °· Your heart rate increases. °· You have a cough. °· You have chills. °· You have a fever. °  °This information is not intended to replace advice given to  you by your health care provider. Make sure you discuss any questions you have with your health care provider. °  °Document Released: 07/30/2011 Document Revised: 08/15/2013 Document Reviewed: 05/29/2013 °Elsevier Interactive Patient Education ©2016 [employer]vier Inc. ° °

## 2015-08-07 NOTE — MAU Provider Note (Signed)
History   [id number]   Chief Complaint  Patient presents with  . Follow-up    HPI Misty Mckenzie is a 24 y.o. female G1P0 here for follow-up BHCG s/p MTX. This is day 7 s/p methotrexate for ectopic pregnancy.  Patient denies abdominal pain or vaginal bleeding.     Patient's last menstrual period was 06/09/2015.  OB History  Gravida Para Term Preterm AB SAB TAB Ectopic Multiple Living  1             # Outcome Date GA Lbr Len/2nd Weight Sex Delivery Anes PTL Lv  1 Current               No past medical history on file.  Family History  Problem Relation Age of Onset  . Cancer Maternal Grandmother   . Diabetes Maternal Grandmother     Social History   Social History  . Marital Status: Single    Spouse Name: N/A  . Number of Children: N/A  . Years of Education: N/A   Social History Main Topics  . Smoking status: Never Smoker   . Smokeless tobacco: Never Used  . Alcohol Use: No  . Drug Use: Not on file  . Sexual Activity: Not on file   Other Topics Concern  . Not on file   Social History Narrative    No Known Allergies  No current facility-administered medications on file prior to encounter.   No current outpatient prescriptions on file prior to encounter.     Physical Exam   Filed Vitals:   08/07/15 1223 08/07/15 1255  BP: 117/76   Pulse: 91   Temp: 98.4 F (36.9 C)   TempSrc: Oral   Resp: 18   Height:  5\' 4"  (1.626 m)  Weight:  104 lb 3.2 oz (47.265 kg)    Physical Exam  Constitutional: She appears well-developed and well-nourished. No distress.  HENT:  Head: Normocephalic and atraumatic.  Cardiovascular: Normal rate.   Respiratory: Effort normal. No respiratory distress.  GI: Soft. There is no tenderness.  Musculoskeletal: Normal range of motion.  Skin: She is not diaphoretic.  Psychiatric: She has a normal mood and affect. Her behavior is normal. Judgment and thought content normal.    MAU Course  Procedures Component     Latest  Ref Rng 08/01/2015 08/04/2015 08/07/2015  HCG, Beta Chain, Quant, S     <5 mIU/mL 961 (H) 1139 (H) 1082 (H)   MDM BHCG did not drop > 15 % S/w Dr. Macon LargeAnyanwu, will give 2nd dose of MTX Discussed the plan with patient who is agreeable.  Assessment and Plan  24 y.o. G1P0 at 5734w3d wks Pregnancy Follow-up BHCG 1. Ectopic pregnancy    P: Discharge home Strict return precautions Return Saturday for day 4 labs  Judeth HornErin Treasa Bradshaw, NP 08/07/2015 12:55 PM

## 2015-08-10 ENCOUNTER — Inpatient Hospital Stay (HOSPITAL_COMMUNITY)
Admission: AD | Admit: 2015-08-10 | Discharge: 2015-08-10 | Disposition: A | Discharge status: Home / Self Care | Payer: BLUE CROSS/BLUE SHIELD | Source: Ambulatory Visit | Attending: Obstetrics & Gynecology | Admitting: Obstetrics & Gynecology

## 2015-08-10 DIAGNOSIS — O3680X0 Pregnancy with inconclusive fetal viability, not applicable or unspecified: Secondary | ICD-10-CM

## 2015-08-10 DIAGNOSIS — O0281 Inappropriate change in quantitative human chorionic gonadotropin (hCG) in early pregnancy: Secondary | ICD-10-CM | POA: Diagnosis present

## 2015-08-10 LAB — HCG, QUANTITATIVE, PREGNANCY: HCG, BETA CHAIN, QUANT, S: 700 m[IU]/mL — AB (ref <5)

## 2015-08-10 NOTE — MAU Note (Signed)
Patient presents at [redacted] weeks gestation for repeat labwork. Denies any complications.

## 2015-08-10 NOTE — Discharge Instructions (Signed)
Methotrexate Treatment for an Ectopic Pregnancy °Methotrexate is a medicine that treats ectopic pregnancy by stopping the growth of the fertilized egg. It also helps your body absorb tissue from the egg. This takes between 2 weeks and 6 weeks. Most ectopic pregnancies can be successfully treated with methotrexate if they are detected early enough. °LET YOUR HEALTH CARE PROVIDER KNOW ABOUT: °· Any allergies you have. °· All medicines you are taking, including vitamins, herbs, eye drops, creams, and over-the-counter medicines. °· Medical conditions you have. °RISKS AND COMPLICATIONS °Generally, this is a safe treatment. However, as with any treatment, problems can occur. Possible problems or side effects include: °· Nausea. °· Vomiting. °· Diarrhea. °· Abdominal cramping. °· Mouth sores. °· Increased vaginal bleeding or spotting.   °· Swelling or irritation of the lining of your lungs (pneumonitis).  °· Failed treatment and continuation of the pregnancy.   °· Liver damage. °· Hair loss. °There is still a risk of the ectopic pregnancy rupturing while using the methotrexate. °BEFORE THE PROCEDURE °Before you take the medicine:  °· Liver tests, kidney tests, and a complete blood test are performed. °· Blood tests are performed to measure the pregnancy hormone levels and to determine your blood type. °· If you are Rh-negative and the father is Rh-positive or his Rh type is not known, you will be given a Rho (D) immune globulin shot. °PROCEDURE  °There are two methods that your health care provider may use to prescribe methotrexate. One method involves a single dose or injection of the medicine. Another method involves a series of doses given through several injections.  °AFTER THE PROCEDURE °· You may have some abdominal cramping, vaginal bleeding, and fatigue in the first few days after taking methotrexate. °· Blood tests will be taken for several weeks to check the pregnancy hormone levels. The blood tests are performed  until there is no more pregnancy hormone detected in the blood. °  °This information is not intended to replace advice given to you by your health care provider. Make sure you discuss any questions you have with your health care provider. °  °Document Released: 08/04/2001 Document Revised: 08/31/2014 Document Reviewed: 05/29/2013 °Elsevier Interactive Patient Education ©2016 [employer] Inc. ° °

## 2015-08-10 NOTE — MAU Provider Note (Signed)
S:  Misty Mckenzie is a 24 y.o. female G1P0 at 911w6d presenting to MAU for a follow-up day # 4 beta hcg level after receiving a second dose of MTX for treatment of inappropriate  rise in beta hcg levels and pregnancy of unknown location.   Currently the patient denies pain or bleeding.    O:  GENERAL: Well-developed, well-nourished female in no acute distress.  LUNGS: Effort normal SKIN: Warm, dry and without erythema PSYCH: Normal mood and affect  Filed Vitals:   08/10/15 1532  BP: 114/66  Pulse: 83  Temp: 98.3 F (36.8 C)  Resp: 16   MDM:  Beta hcg levels:  12/1: 614 12/3: 635 12/5: 941 12/8: 961 first dose of MTX given  12/11: 1139  12/14:  1082 second dose of MTX given.  12/17: 700  B positive blood type    A:  1. Pregnancy of unknown anatomic location   2. Inappropriate change in quantitative hCG in early pregnancy    P:  Discharge home in stable condition.  Return to MAU on Tuesday for Day #7 Hcg level from second dose of MTX Strict ectopic precautions Pelvic rest Return sooner if symptoms worsen    Duane LopeJennifer I Yashica Sterbenz, NP 08/10/2015 5:12 PM

## 2015-08-13 ENCOUNTER — Inpatient Hospital Stay (HOSPITAL_COMMUNITY)
Admission: AD | Admit: 2015-08-13 | Discharge: 2015-08-13 | Disposition: A | Discharge status: Home / Self Care | Payer: BLUE CROSS/BLUE SHIELD | Source: Ambulatory Visit | Attending: Obstetrics & Gynecology | Admitting: Obstetrics & Gynecology

## 2015-08-13 DIAGNOSIS — O009 Unspecified ectopic pregnancy without intrauterine pregnancy: Secondary | ICD-10-CM | POA: Diagnosis present

## 2015-08-13 LAB — HCG, QUANTITATIVE, PREGNANCY: hCG, Beta Chain, Quant, S: 514 m[IU]/mL — ABNORMAL HIGH (ref <5)

## 2015-08-13 NOTE — MAU Note (Signed)
PT  HAS  COME  TO HAVE  REPEAT  LABS.   WAS  HERE ON 12-17-   PAIN WAS 0.     PAIN  NOW  IS  0.        ON 12-17-   ON HER PAD -  BROWN   D/C. Marland Kitchen.   TODAY     HAS  LESS  BROWN  D/C.

## 2015-08-13 NOTE — MAU Provider Note (Signed)
Ms. Misty Mckenzie  is a 24 y.o. G1P0 at 236w2d who presents to MAU today for follow-up quant hCG on day #7 after second dose of MTX. The patient denies vaginal bleeding or abdominal pain or fever today.   BP 110/70 mmHg  Pulse 86  Temp(Src) 98.6 F (37 C) (Oral)  Resp 20  LMP 06/09/2015  CONSTITUTIONAL: Well-developed, well-nourished female in no acute distress.  ENT: External right and left ear normal.  EYES: EOM intact, conjunctivae normal.  MUSCULOSKELETAL: Normal range of motion.  CARDIOVASCULAR: Regular heart rate RESPIRATORY: Normal effort NEUROLOGICAL: Alert and oriented to person, place, and time.  SKIN: Skin is warm and dry. No rash noted. Not diaphoretic. No erythema. No pallor. PSYCH: Normal mood and affect. Normal behavior. Normal judgment and thought content.  12/1: 614 12/3: 635 12/5: 941 12/8: 961 first dose of MTX given  12/11: 1139  12/14: 1082 second dose of MTX given.  12/17: 700  Results for orders placed or performed during the hospital encounter of 08/13/15 (from the past 24 hour(s))  hCG, quantitative, pregnancy     Status: Abnormal   Collection Time: 08/13/15  8:40 PM  Result Value Ref Range   hCG, Beta Chain, Quant, S 514 (H) <5 mIU/mL    A: Decline in hCG S/P MTX x 2 for Ectopic pregnancy  P: Discharge home Ectopic precautions discussed Patient will follow-up for repeat hCG in 1 week with [facility]. They will call with appointment time.  Patient may return to MAU as needed or if her condition were to change or worsen   Marny LowensteinJulie N Wenzel, PA-C 08/13/2015 9:37 PM

## 2015-08-13 NOTE — Discharge Instructions (Signed)
Methotrexate Treatment for an Ectopic Pregnancy, Care After °Refer to this sheet in the next few weeks. These instructions provide you with information on caring for yourself after your procedure. Your health care provider may also give you more specific instructions. Your treatment has been planned according to current medical practices, but problems sometimes occur. Call your health care provider if you have any problems or questions after your procedure. °WHAT TO EXPECT AFTER THE PROCEDURE °You may have some abdominal cramping, vaginal bleeding, and fatigue in the first few days after taking methotrexate. Some other possible side effects of methotrexate include: °· Nausea. °· Vomiting. °· Diarrhea. °· Mouth sores. °· Swelling or irritation of the lining of your lungs (pneumonitis). °· Liver damage. °· Hair loss. °HOME CARE INSTRUCTIONS  °After you have received the methotrexate medicine, you need to be careful of your activities and watch your condition for several weeks. It may take 1 week before your hormone levels return to normal. °· Keep all follow-up appointments as directed by your health care provider. °· Avoid traveling too far away from your health care provider. °· Do not have sexual intercourse until your health care provider says it is safe to do so. °· You may resume your usual diet. °· Limit strenuous activity. °· Do not take folic acid, prenatal vitamins, or other vitamins that contain folic acid. °· Do not take aspirin, ibuprofen, or naproxen (nonsteroidal anti-inflammatory drugs [NSAIDs]). °· Do not drink alcohol. °SEEK MEDICAL CARE IF:  °· You cannot control your nausea and vomiting. °· You cannot control your diarrhea. °· You have sores in your mouth and want treatment. °· You need pain medicine for your abdominal pain. °· You have a rash. °· You are having a reaction to the medicine. °SEEK IMMEDIATE MEDICAL CARE IF:  °· You have increasing abdominal or pelvic pain. °· You notice increased  bleeding. °· You feel light-headed, or you faint. °· You have shortness of breath. °· Your heart rate increases. °· You have a cough. °· You have chills. °· You have a fever. °  °This information is not intended to replace advice given to you by your health care provider. Make sure you discuss any questions you have with your health care provider. °  °Document Released: 07/30/2011 Document Revised: 08/15/2013 Document Reviewed: 05/29/2013 °Elsevier Interactive Patient Education ©2016 [employer] Inc[employer] ° °

## 2015-08-15 ENCOUNTER — Other Ambulatory Visit: Payer: Self-pay | Admitting: Occupational Medicine

## 2015-08-15 ENCOUNTER — Ambulatory Visit
Admission: RE | Admit: 2015-08-15 | Discharge: 2015-08-15 | Disposition: A | Discharge status: Home / Self Care | Payer: No Typology Code available for payment source | Source: Ambulatory Visit | Attending: Occupational Medicine | Admitting: Occupational Medicine

## 2015-08-15 DIAGNOSIS — Z021 Encounter for pre-employment examination: Secondary | ICD-10-CM

## 2015-08-20 ENCOUNTER — Other Ambulatory Visit: Payer: PRIVATE HEALTH INSURANCE

## 2015-08-20 DIAGNOSIS — Z8759 Personal history of other complications of pregnancy, childbirth and the puerperium: Secondary | ICD-10-CM

## 2015-08-21 LAB — HCG, QUANTITATIVE, PREGNANCY: HCG, BETA CHAIN, QUANT, S: 198 m[IU]/mL — AB

## 2015-08-27 ENCOUNTER — Other Ambulatory Visit: Payer: Self-pay

## 2015-08-27 DIAGNOSIS — Z8759 Personal history of other complications of pregnancy, childbirth and the puerperium: Secondary | ICD-10-CM

## 2015-08-28 LAB — HCG, QUANTITATIVE, PREGNANCY: hCG, Beta Chain, Quant, S: 105.5 m[IU]/mL — ABNORMAL HIGH

## 2015-08-29 ENCOUNTER — Telehealth: Payer: Self-pay

## 2015-08-29 NOTE — Telephone Encounter (Signed)
Pt wanted to know if she should come back in for another HCG. Her level 2 days ago was 105.5. Please Advise!

## 2015-08-29 NOTE — Telephone Encounter (Signed)
Patient coming in for appt on 09/04/2015 to see provider and for repeat HCG

## 2015-08-30 ENCOUNTER — Telehealth: Payer: Self-pay

## 2015-08-30 ENCOUNTER — Ambulatory Visit: Payer: Self-pay | Admitting: Family Medicine

## 2015-08-30 ENCOUNTER — Encounter: Payer: Self-pay | Admitting: Family Medicine

## 2015-08-30 VITALS — BP 126/78 | HR 98 | Temp 98.6°F | Wt 107.7 lb

## 2015-08-30 DIAGNOSIS — O009 Unspecified ectopic pregnancy without intrauterine pregnancy: Secondary | ICD-10-CM

## 2015-08-30 NOTE — Progress Notes (Signed)
  Patient was left prior to being seen by me.  She was seen by the PA student, who reports that the patient had no pain and was here for a bHCG.  PA student also reported a benign abdominal exam - soft, nontender.  Patient had another appt that she needed to be at.  Nursing staff are attempting to contact patient to have her return for bhCG.

## 2015-08-30 NOTE — Telephone Encounter (Signed)
Pt was seen in the clinic this am and left without being seen. Attempted to to call patient x 2 but patient did not answer the phone. Per Dr.Stinson patient just needs to come back and have a lab drawn for BHCG. Orders have been placed

## 2015-09-04 ENCOUNTER — Telehealth: Payer: Self-pay | Admitting: *Deleted

## 2015-09-04 ENCOUNTER — Ambulatory Visit: Payer: Self-pay | Admitting: Advanced Practice Midwife

## 2015-09-04 NOTE — Telephone Encounter (Signed)
Shalandra needs a repeat bhcg per Dr.Stinson and Dr. Debroah LoopArnold . Last done 08/27/15.  ,   Oneida Alaralled Ellanie and left a message we need her to call us to reschedule a lab appt.

## 2015-09-05 ENCOUNTER — Other Ambulatory Visit: Payer: Self-pay

## 2015-09-05 DIAGNOSIS — O039 Complete or unspecified spontaneous abortion without complication: Secondary | ICD-10-CM

## 2015-09-06 LAB — HCG, QUANTITATIVE, PREGNANCY: hCG, Beta Chain, Quant, S: <2 m[IU]/mL

## 2015-09-09 ENCOUNTER — Telehealth: Payer: Self-pay | Admitting: *Deleted

## 2015-09-09 NOTE — Telephone Encounter (Signed)
Called Misty Mckenzie and notified her result was <2.0 which is normal. No further followup is needed. Call us if any further issues. She voices understanding.

## 2015-09-09 NOTE — Telephone Encounter (Signed)
Dijon called and left a message she would like results of her bhcg.

## 2015-10-30 ENCOUNTER — Encounter (HOSPITAL_COMMUNITY): Payer: Self-pay | Admitting: Emergency Medicine

## 2015-10-30 ENCOUNTER — Emergency Department (HOSPITAL_COMMUNITY)
Admission: EM | Admit: 2015-10-30 | Discharge: 2015-10-30 | Disposition: A | Discharge status: Home / Self Care | Payer: No Typology Code available for payment source | Attending: Emergency Medicine | Admitting: Emergency Medicine

## 2015-10-30 ENCOUNTER — Emergency Department (HOSPITAL_COMMUNITY): Payer: No Typology Code available for payment source

## 2015-10-30 DIAGNOSIS — S60512A Abrasion of left hand, initial encounter: Secondary | ICD-10-CM | POA: Insufficient documentation

## 2015-10-30 DIAGNOSIS — Y9389 Activity, other specified: Secondary | ICD-10-CM | POA: Diagnosis not present

## 2015-10-30 DIAGNOSIS — S6991XA Unspecified injury of right wrist, hand and finger(s), initial encounter: Secondary | ICD-10-CM | POA: Diagnosis not present

## 2015-10-30 DIAGNOSIS — Y998 Other external cause status: Secondary | ICD-10-CM | POA: Insufficient documentation

## 2015-10-30 DIAGNOSIS — S4992XA Unspecified injury of left shoulder and upper arm, initial encounter: Secondary | ICD-10-CM | POA: Diagnosis not present

## 2015-10-30 DIAGNOSIS — F329 Major depressive disorder, single episode, unspecified: Secondary | ICD-10-CM | POA: Insufficient documentation

## 2015-10-30 DIAGNOSIS — M542 Cervicalgia: Secondary | ICD-10-CM

## 2015-10-30 DIAGNOSIS — Z23 Encounter for immunization: Secondary | ICD-10-CM | POA: Diagnosis not present

## 2015-10-30 DIAGNOSIS — Y9289 Other specified places as the place of occurrence of the external cause: Secondary | ICD-10-CM | POA: Diagnosis not present

## 2015-10-30 DIAGNOSIS — S199XXA Unspecified injury of neck, initial encounter: Secondary | ICD-10-CM | POA: Diagnosis not present

## 2015-10-30 DIAGNOSIS — M25512 Pain in left shoulder: Secondary | ICD-10-CM

## 2015-10-30 MED ORDER — METHOCARBAMOL 500 MG PO TABS: 500.0000 mg | ORAL_TABLET | Freq: Two times a day (BID) | ORAL | Status: DC | PRN

## 2015-10-30 MED ORDER — TETANUS-DIPHTH-ACELL PERTUSSIS 5-2.5-18.5 LF-MCG/0.5 IM SUSP
0.5000 mL | Freq: Once | INTRAMUSCULAR | Status: AC
  Administered 2015-10-30: 0.5 mL via INTRAMUSCULAR
  Filled 2015-10-30: qty 0.5

## 2015-10-30 MED ORDER — NAPROXEN 250 MG PO TABS: 250.0000 mg | ORAL_TABLET | Freq: Two times a day (BID) | ORAL | Status: DC

## 2015-10-30 NOTE — ED Notes (Signed)
Esginature not working. Pt agreeable to discharge
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
## 2015-10-30 NOTE — ED Notes (Signed)
RN placed C collar on pt
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
## 2016-02-07 IMAGING — US US OB COMP LESS 14 WK
1 series · 15 of 28 positions shown · non-contrast
Comparison: None.

CLINICAL DATA: Cramping and bleeding for 1 week

EXAM:
OBSTETRIC <14 WK US AND TRANSVAGINAL OB US
TECHNIQUE: Both transabdominal and transvaginal ultrasound examinations were
performed for complete evaluation of the gestation as well as the
maternal uterus, adnexal regions, and pelvic cul-de-sac.
Transvaginal technique was performed to assess early pregnancy.

[Series 1: us ob comp less 14 wk · 15 of 42 slices shown]
[im 1/42]
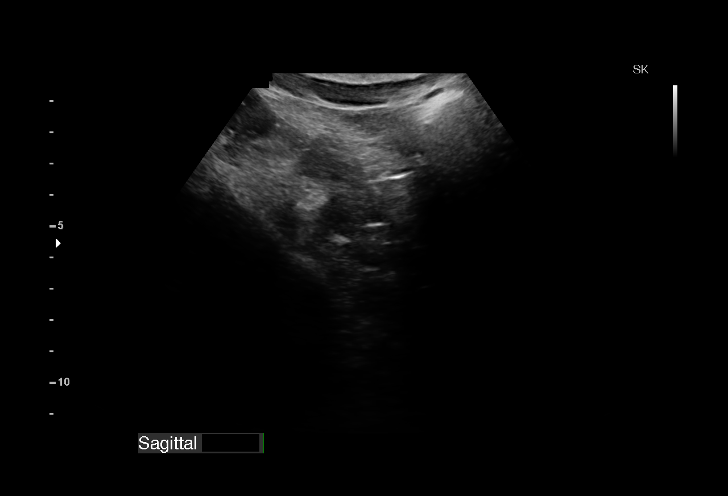
[im 4/42]
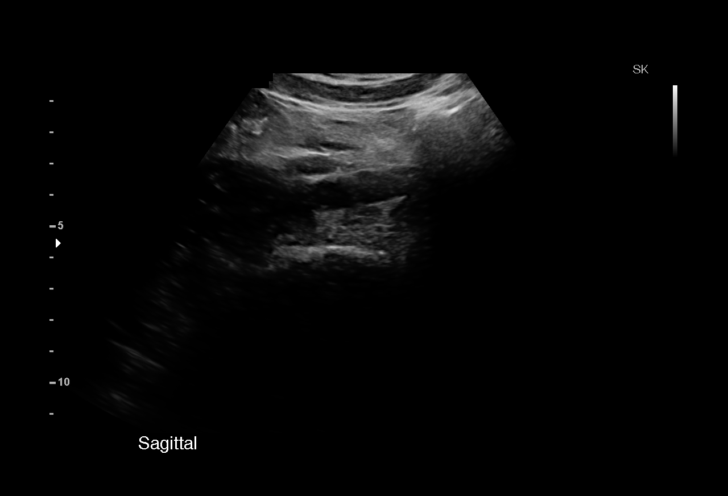
[im 7/42]
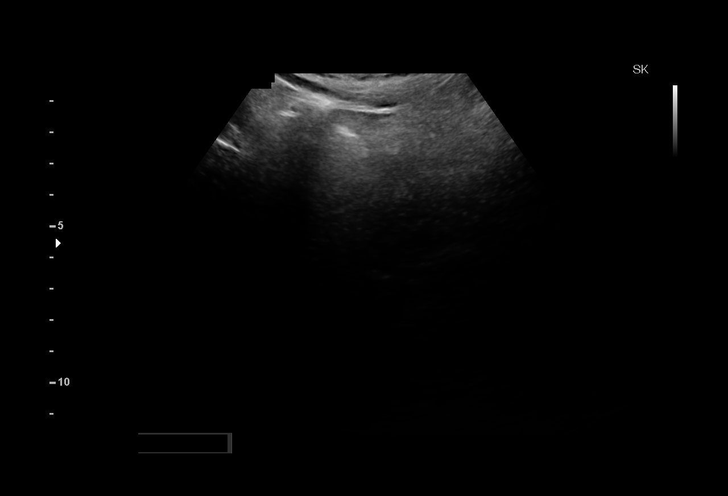
[im 10/42]
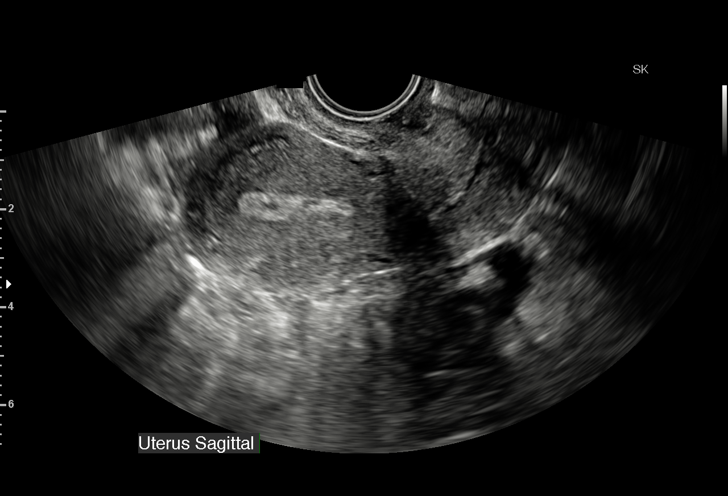
[im 13/42]
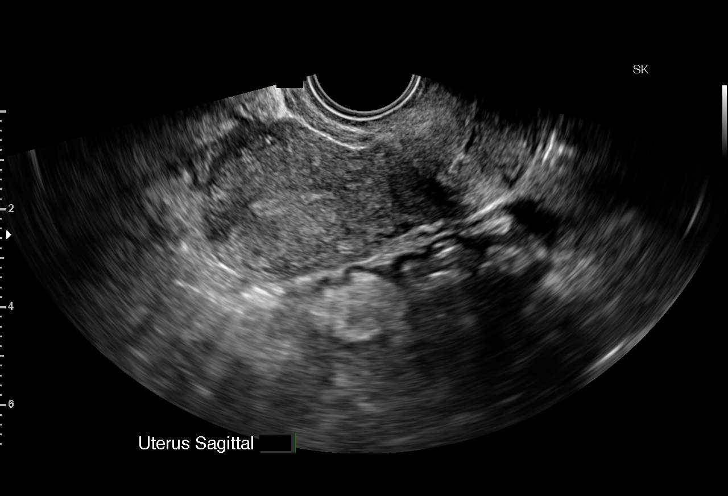
[im 16/42]
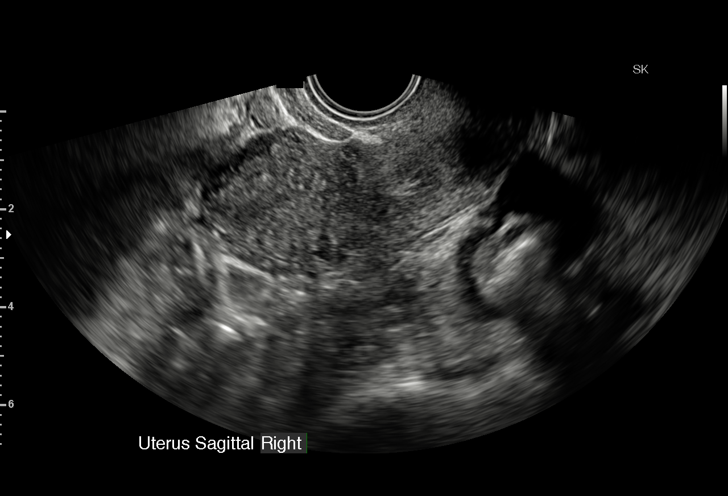
[im 19/42]
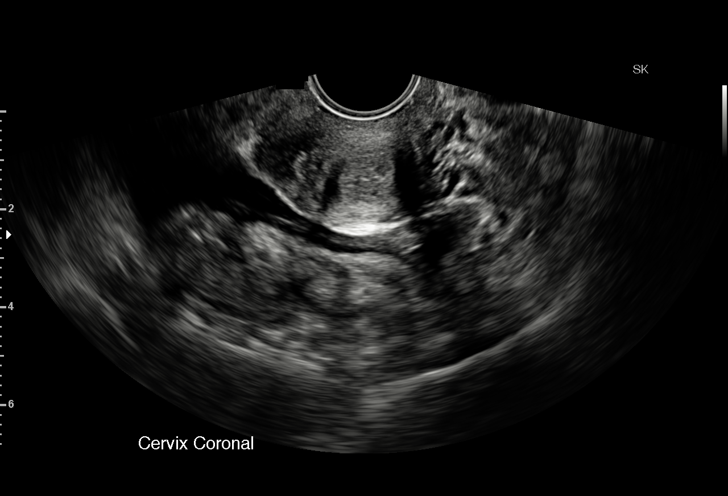
[im 22/42]
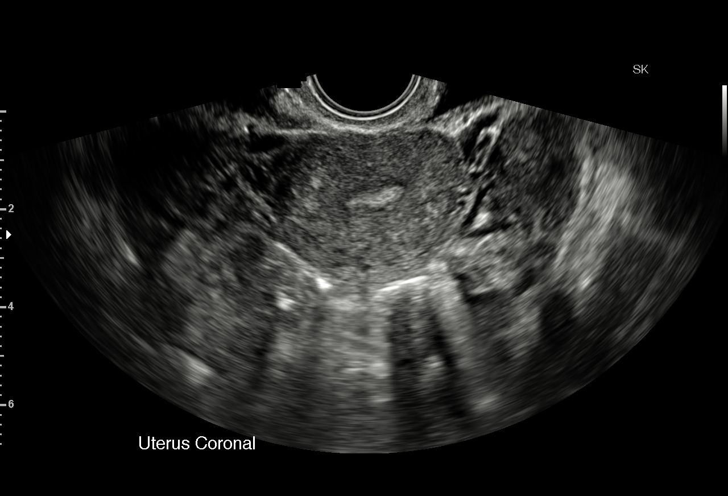
[im 23/42]
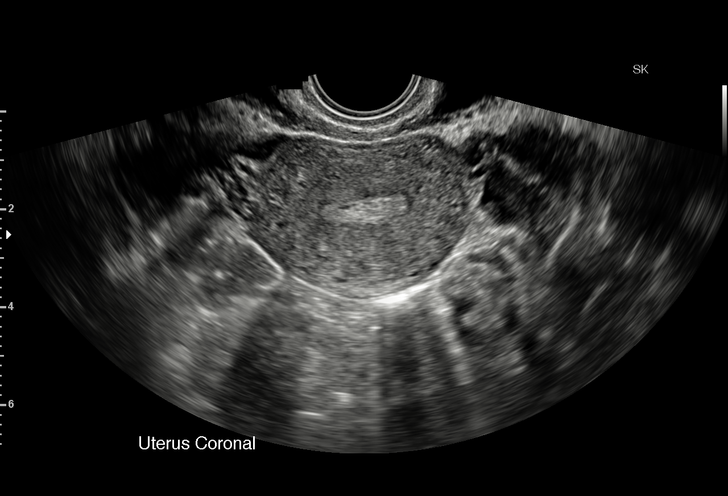
[im 26/42]
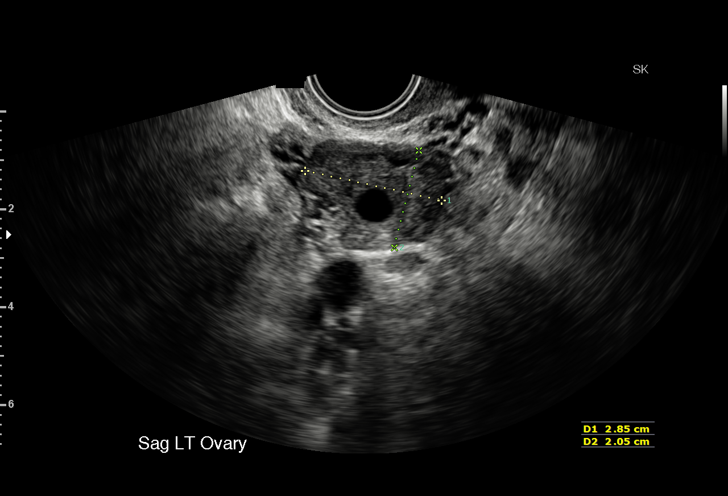
[im 29/42]
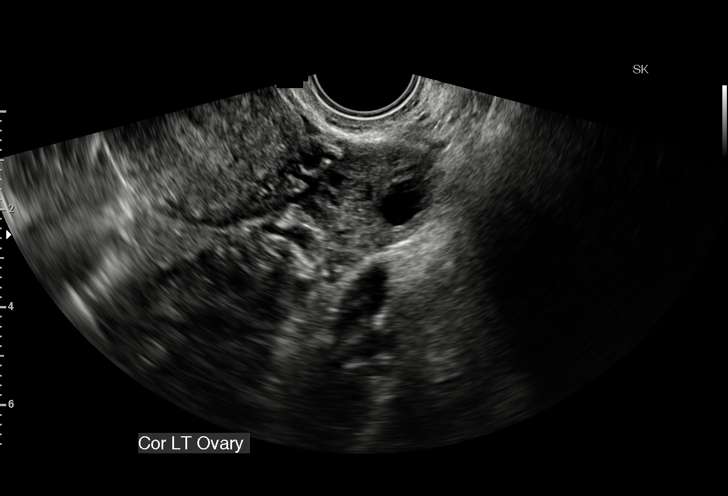
[im 32/42]
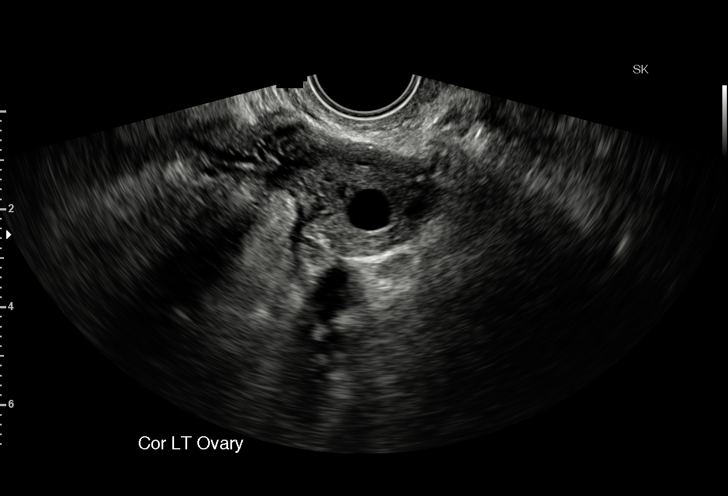
[im 35/42]
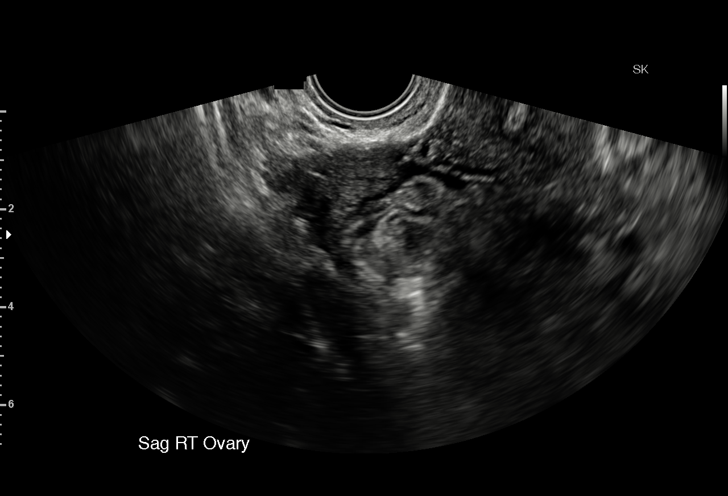
[im 38/42]
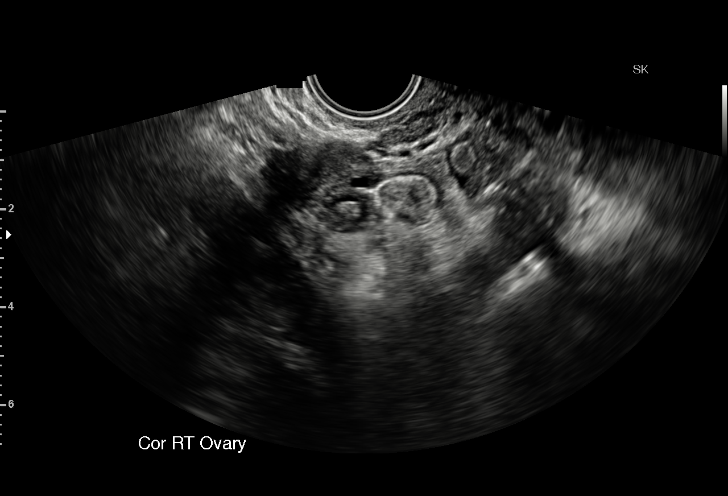
[im 42/42]
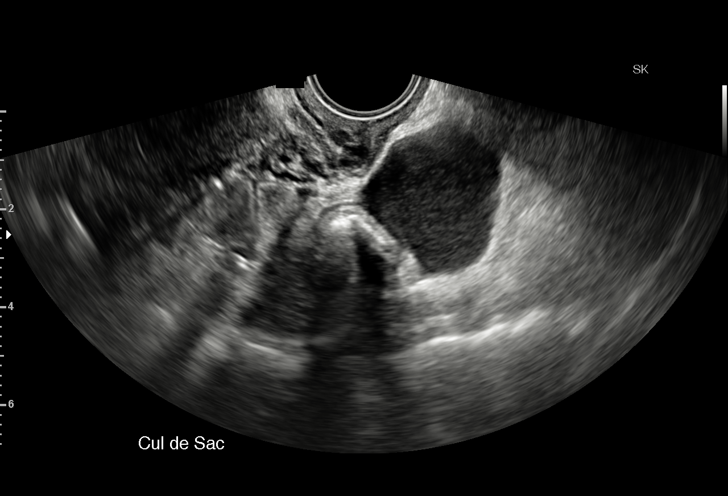

[15 of 28 positions shown; findings below may reference images not displayed]

FINDINGS: Intrauterine gestational sac: None

Yolk sac:  No

Embryo:  No

Cardiac Activity: No

Heart Rate:   bpm

MSD:   mm    w     d

CRL:    mm    w    d                  US EDC:

Maternal uterus/adnexae: Normal ovaries. Mildly complex fluid or
blood in the cul-de-sac.
IMPRESSION: No visible gestational sac. Small volume blood or fluid in the
cul-de-sac. No adnexal mass or cyst. The findings are nonspecific
and could represent early pregnancy failure, intrauterine gestation
which is too small to visualize, ectopic gestation.

## 2016-09-23 ENCOUNTER — Ambulatory Visit (INDEPENDENT_AMBULATORY_CARE_PROVIDER_SITE_OTHER): Payer: Commercial Managed Care - PPO

## 2016-09-23 ENCOUNTER — Encounter: Payer: Self-pay | Admitting: *Deleted

## 2016-09-23 DIAGNOSIS — Z3201 Encounter for pregnancy test, result positive: Secondary | ICD-10-CM

## 2016-09-23 DIAGNOSIS — N926 Irregular menstruation, unspecified: Secondary | ICD-10-CM | POA: Diagnosis not present

## 2016-09-23 LAB — POCT URINE PREGNANCY: Preg Test, Ur: POSITIVE — AB

## 2016-09-23 NOTE — Progress Notes (Signed)
Pt presents for UPT. UPT positive. EDD 05/13/17. Pt has began PNV's and to schedule NOB appt.

## 2016-10-29 ENCOUNTER — Encounter: Payer: Commercial Managed Care - PPO | Admitting: Obstetrics & Gynecology

## 2016-11-02 ENCOUNTER — Other Ambulatory Visit (HOSPITAL_COMMUNITY)
Admission: RE | Admit: 2016-11-02 | Discharge: 2016-11-02 | Disposition: A | Discharge status: Home / Self Care | Payer: Commercial Managed Care - PPO | Source: Ambulatory Visit | Attending: Obstetrics and Gynecology | Admitting: Obstetrics and Gynecology

## 2016-11-02 ENCOUNTER — Ambulatory Visit (INDEPENDENT_AMBULATORY_CARE_PROVIDER_SITE_OTHER): Payer: Commercial Managed Care - PPO | Admitting: Obstetrics and Gynecology

## 2016-11-02 ENCOUNTER — Encounter: Payer: Self-pay | Admitting: Obstetrics and Gynecology

## 2016-11-02 ENCOUNTER — Encounter: Payer: Self-pay | Admitting: *Deleted

## 2016-11-02 VITALS — BP 114/75 | HR 111 | Wt 105.0 lb

## 2016-11-02 DIAGNOSIS — Z01419 Encounter for gynecological examination (general) (routine) without abnormal findings: Secondary | ICD-10-CM | POA: Insufficient documentation

## 2016-11-02 DIAGNOSIS — Z113 Encounter for screening for infections with a predominantly sexual mode of transmission: Secondary | ICD-10-CM | POA: Diagnosis present

## 2016-11-02 DIAGNOSIS — Z1151 Encounter for screening for human papillomavirus (HPV): Secondary | ICD-10-CM

## 2016-11-02 DIAGNOSIS — Z3481 Encounter for supervision of other normal pregnancy, first trimester: Secondary | ICD-10-CM

## 2016-11-02 DIAGNOSIS — Z124 Encounter for screening for malignant neoplasm of cervix: Secondary | ICD-10-CM | POA: Diagnosis not present

## 2016-11-02 DIAGNOSIS — O0990 Supervision of high risk pregnancy, unspecified, unspecified trimester: Secondary | ICD-10-CM | POA: Insufficient documentation

## 2016-11-02 DIAGNOSIS — Z3400 Encounter for supervision of normal first pregnancy, unspecified trimester: Secondary | ICD-10-CM

## 2016-11-02 NOTE — Progress Notes (Signed)
  Subjective:    Misty Mckenzie is a G2P0010 5073w4d being seen today for her first obstetrical visit.  Her obstetrical history is significant for previous ectopic pregnancy. Patient does intend to breast feed. Pregnancy history fully reviewed.  Patient reports no complaints.  There were no vitals filed for this visit.  HISTORY: OB History  Gravida Para Term Preterm AB Living  2       1    SAB TAB Ectopic Multiple Live Births      1        # Outcome Date GA Lbr Len/2nd Weight Sex Delivery Anes PTL Lv  2 Current           1 Ectopic 2017 3241w0d            No past medical history on file. No past surgical history on file. Family History  Problem Relation Age of Onset  . Cancer Maternal Grandmother   . Diabetes Maternal Grandmother      Exam    Uterus:   12-week size uterus  Pelvic Exam:    Perineum: Normal Perineum   Vulva: normal   Vagina:  normal mucosa, normal discharge   pH:    Cervix: nulliparous appearance   Adnexa: normal adnexa and no mass, fullness, tenderness   Bony Pelvis: gynecoid  System: Breast:  normal appearance, no masses or tenderness   Skin: normal coloration and turgor, no rashes    Neurologic: oriented, no focal deficits   Extremities: normal strength, tone, and muscle mass   HEENT extra ocular movement intact   Mouth/Teeth mucous membranes moist, pharynx normal without lesions and dental hygiene good   Neck supple and no masses   Cardiovascular: regular rate and rhythm   Respiratory:  appears well, vitals normal, no respiratory distress, acyanotic, normal RR, ear and throat exam is normal, neck free of mass or lymphadenopathy, chest clear, no wheezing, crepitations, rhonchi, normal symmetric air entry   Abdomen: soft, non-tender; bowel sounds normal; no masses,  no organomegaly   Urinary:       Assessment:    Pregnancy: G2P0010 There are no active problems to display for this patient.       Plan:     Initial labs drawn. Prenatal  vitamins. Problem list reviewed and updated. Genetic Screening discussed Quad Screen: requested. Will do it at next visit  Ultrasound discussed; fetal survey: ordered.  Follow up in 4 weeks. 50% of 30 min visit spent on counseling and coordination of care.     Darling Cieslewicz 11/02/2016

## 2016-11-02 NOTE — Progress Notes (Signed)
cPt states that she had some blurry vision and dizziness at work yesterday.  Pt states minimal N&V. Pt has been signed up for babyscripts at today's visit.

## 2016-11-02 NOTE — Patient Instructions (Signed)
 Second Trimester of Pregnancy The second trimester is from week 14 through week 27 (months 4 through 6). The second trimester is often a time when you feel your best. Your body has adjusted to being pregnant, and you begin to feel better physically. Usually, morning sickness has lessened or quit completely, you may have more energy, and you may have an increase in appetite. The second trimester is also a time when the fetus is growing rapidly. At the end of the sixth month, the fetus is about 9 inches long and weighs about 1 pounds. You will likely begin to feel the baby move (quickening) between 16 and 20 weeks of pregnancy. Body changes during your second trimester Your body continues to go through many changes during your second trimester. The changes vary from woman to woman.  Your weight will continue to increase. You will notice your lower abdomen bulging out.  You may begin to get stretch marks on your hips, abdomen, and breasts.  You may develop headaches that can be relieved by medicines. The medicines should be approved by your health care provider.  You may urinate more often because the fetus is pressing on your bladder.  You may develop or continue to have heartburn as a result of your pregnancy.  You may develop constipation because certain hormones are causing the muscles that push waste through your intestines to slow down.  You may develop hemorrhoids or swollen, bulging veins (varicose veins).  You may have back pain. This is caused by: ? Weight gain. ? Pregnancy hormones that are relaxing the joints in your pelvis. ? A shift in weight and the muscles that support your balance.  Your breasts will continue to grow and they will continue to become tender.  Your gums may bleed and may be sensitive to brushing and flossing.  Dark spots or blotches (chloasma, mask of pregnancy) may develop on your face. This will likely fade after the baby is born.  A dark line from  your belly button to the pubic area (linea nigra) may appear. This will likely fade after the baby is born.  You may have changes in your hair. These can include thickening of your hair, rapid growth, and changes in texture. Some women also have hair loss during or after pregnancy, or hair that feels dry or thin. Your hair will most likely return to normal after your baby is born.  What to expect at prenatal visits During a routine prenatal visit:  You will be weighed to make sure you and the fetus are growing normally.  Your blood pressure will be taken.  Your abdomen will be measured to track your baby's growth.  The fetal heartbeat will be listened to.  Any test results from the previous visit will be discussed.  Your health care provider may ask you:  How you are feeling.  If you are feeling the baby move.  If you have had any abnormal symptoms, such as leaking fluid, bleeding, severe headaches, or abdominal cramping.  If you are using any tobacco products, including cigarettes, chewing tobacco, and electronic cigarettes.  If you have any questions.  Other tests that may be performed during your second trimester include:  Blood tests that check for: ? Low iron levels (anemia). ? High blood sugar that affects pregnant women (gestational diabetes) between 24 and 28 weeks. ? Rh antibodies. This is to check for a protein on red blood cells (Rh factor).  Urine tests to check for infections, diabetes,   or protein in the urine.  An ultrasound to confirm the proper growth and development of the baby.  An amniocentesis to check for possible genetic problems.  Fetal screens for spina bifida and Down syndrome.  HIV (human immunodeficiency virus) testing. Routine prenatal testing includes screening for HIV, unless you choose not to have this test.  Follow these instructions at home: Medicines  Follow your health care provider's instructions regarding medicine use. Specific  medicines may be either safe or unsafe to take during pregnancy.  Take a prenatal vitamin that contains at least 600 micrograms (mcg) of folic acid.  If you develop constipation, try taking a stool softener if your health care provider approves. Eating and drinking  Eat a balanced diet that includes fresh fruits and vegetables, whole grains, good sources of protein such as meat, eggs, or tofu, and low-fat dairy. Your health care provider will help you determine the amount of weight gain that is right for you.  Avoid raw meat and uncooked cheese. These carry germs that can cause birth defects in the baby.  If you have low calcium intake from food, talk to your health care provider about whether you should take a daily calcium supplement.  Limit foods that are high in fat and processed sugars, such as fried and sweet foods.  To prevent constipation: ? Drink enough fluid to keep your urine clear or pale yellow. ? Eat foods that are high in fiber, such as fresh fruits and vegetables, whole grains, and beans. Activity  Exercise only as directed by your health care provider. Most women can continue their usual exercise routine during pregnancy. Try to exercise for 30 minutes at least 5 days a week. Stop exercising if you experience uterine contractions.  Avoid heavy lifting, wear low heel shoes, and practice good posture.  A sexual relationship may be continued unless your health care provider directs you otherwise. Relieving pain and discomfort  Wear a good support bra to prevent discomfort from breast tenderness.  Take warm sitz baths to soothe any pain or discomfort caused by hemorrhoids. Use hemorrhoid cream if your health care provider approves.  Rest with your legs elevated if you have leg cramps or low back pain.  If you develop varicose veins, wear support hose. Elevate your feet for 15 minutes, 3-4 times a day. Limit salt in your diet. Prenatal Care  Write down your questions.  Take them to your prenatal visits.  Keep all your prenatal visits as told by your health care provider. This is important. Safety  Wear your seat belt at all times when driving.  Make a list of emergency phone numbers, including numbers for family, friends, the hospital, and police and fire departments. General instructions  Ask your health care provider for a referral to a local prenatal education class. Begin classes no later than the beginning of month 6 of your pregnancy.  Ask for help if you have counseling or nutritional needs during pregnancy. Your health care provider can offer advice or refer you to specialists for help with various needs.  Do not use hot tubs, steam rooms, or saunas.  Do not douche or use tampons or scented sanitary pads.  Do not cross your legs for long periods of time.  Avoid cat litter boxes and soil used by cats. These carry germs that can cause birth defects in the baby and possibly loss of the fetus by miscarriage or stillbirth.  Avoid all smoking, herbs, alcohol, and unprescribed drugs. Chemicals in these products   can affect the formation and growth of the baby.  Do not use any products that contain nicotine or tobacco, such as cigarettes and e-cigarettes. If you need help quitting, ask your health care provider.  Visit your dentist if you have not gone yet during your pregnancy. Use a soft toothbrush to brush your teeth and be gentle when you floss. Contact a health care provider if:  You have dizziness.  You have mild pelvic cramps, pelvic pressure, or nagging pain in the abdominal area.  You have persistent nausea, vomiting, or diarrhea.  You have a bad smelling vaginal discharge.  You have pain when you urinate. Get help right away if:  You have a fever.  You are leaking fluid from your vagina.  You have spotting or bleeding from your vagina.  You have severe abdominal cramping or pain.  You have rapid weight gain or weight  loss.  You have shortness of breath with chest pain.  You notice sudden or extreme swelling of your face, hands, ankles, feet, or legs.  You have not felt your baby move in over an hour.  You have severe headaches that do not go away when you take medicine.  You have vision changes. Summary  The second trimester is from week 14 through week 27 (months 4 through 6). It is also a time when the fetus is growing rapidly.  Your body goes through many changes during pregnancy. The changes vary from woman to woman.  Avoid all smoking, herbs, alcohol, and unprescribed drugs. These chemicals affect the formation and growth your baby.  Do not use any tobacco products, such as cigarettes, chewing tobacco, and e-cigarettes. If you need help quitting, ask your health care provider.  Contact your health care provider if you have any questions. Keep all prenatal visits as told by your health care provider. This is important. This information is not intended to replace advice given to you by your health care provider. Make sure you discuss any questions you have with your health care provider. Document Released: 08/04/2001 Document Revised: 01/16/2016 Document Reviewed: 10/11/2012 [employer] Interactive Patient Education  2017 [employer]  Contraception Choices Contraception (birth control) is the use of any methods or devices to prevent pregnancy. Below are some methods to help avoid pregnancy. Hormonal methods  Contraceptive implant. This is a thin, plastic tube containing progesterone hormone. It does not contain estrogen hormone. Your health care provider inserts the tube in the inner part of the upper arm. The tube can remain in place for up to 3 years. After 3 years, the implant must be removed. The implant prevents the ovaries from releasing an egg (ovulation), thickens the cervical mucus to prevent sperm from entering the uterus, and thins the lining of the inside of the  uterus.  Progesterone-only injections. These injections are given every 3 months by your health care provider to prevent pregnancy. This synthetic progesterone hormone stops the ovaries from releasing eggs. It also thickens cervical mucus and changes the uterine lining. This makes it harder for sperm to survive in the uterus.  Birth control pills. These pills contain estrogen and progesterone hormone. They work by preventing the ovaries from releasing eggs (ovulation). They also cause the cervical mucus to thicken, preventing the sperm from entering the uterus. Birth control pills are prescribed by a health care provider.Birth control pills can also be used to treat heavy periods.  Minipill. This type of birth control pill contains only the progesterone hormone. They are taken every day   of each month and must be prescribed by your health care provider.  Birth control patch. The patch contains hormones similar to those in birth control pills. It must be changed once a week and is prescribed by a health care provider.  Vaginal ring. The ring contains hormones similar to those in birth control pills. It is left in the vagina for 3 weeks, removed for 1 week, and then a new one is put back in place. The patient must be comfortable inserting and removing the ring from the vagina.A health care provider's prescription is necessary.  Emergency contraception. Emergency contraceptives prevent pregnancy after unprotected sexual intercourse. This pill can be taken right after sex or up to 5 days after unprotected sex. It is most effective the sooner you take the pills after having sexual intercourse. Most emergency contraceptive pills are available without a prescription. Check with your pharmacist. Do not use emergency contraception as your only form of birth control. Barrier methods  Female condom. This is a thin sheath (latex or rubber) that is worn over the penis during sexual intercourse. It can be used with  spermicide to increase effectiveness.  Female condom. This is a soft, loose-fitting sheath that is put into the vagina before sexual intercourse.  Diaphragm. This is a soft, latex, dome-shaped barrier that must be fitted by a health care provider. It is inserted into the vagina, along with a spermicidal jelly. It is inserted before intercourse. The diaphragm should be left in the vagina for 6 to 8 hours after intercourse.  Cervical cap. This is a round, soft, latex or plastic cup that fits over the cervix and must be fitted by a health care provider. The cap can be left in place for up to 48 hours after intercourse.  Sponge. This is a soft, circular piece of polyurethane foam. The sponge has spermicide in it. It is inserted into the vagina after wetting it and before sexual intercourse.  Spermicides. These are chemicals that kill or block sperm from entering the cervix and uterus. They come in the form of creams, jellies, suppositories, foam, or tablets. They do not require a prescription. They are inserted into the vagina with an applicator before having sexual intercourse. The process must be repeated every time you have sexual intercourse. Intrauterine contraception  Intrauterine device (IUD). This is a T-shaped device that is put in a woman's uterus during a menstrual period to prevent pregnancy. There are 2 types: ? Copper IUD. This type of IUD is wrapped in copper wire and is placed inside the uterus. Copper makes the uterus and fallopian tubes produce a fluid that kills sperm. It can stay in place for 10 years. ? Hormone IUD. This type of IUD contains the hormone progestin (synthetic progesterone). The hormone thickens the cervical mucus and prevents sperm from entering the uterus, and it also thins the uterine lining to prevent implantation of a fertilized egg. The hormone can weaken or kill the sperm that get into the uterus. It can stay in place for 3-5 years, depending on which type of IUD  is used. Permanent methods of contraception  Female tubal ligation. This is when the woman's fallopian tubes are surgically sealed, tied, or blocked to prevent the egg from traveling to the uterus.  Hysteroscopic sterilization. This involves placing a small coil or insert into each fallopian tube. Your doctor uses a technique called hysteroscopy to do the procedure. The device causes scar tissue to form. This results in permanent blockage of the fallopian   tubes, so the sperm cannot fertilize the egg. It takes about 3 months after the procedure for the tubes to become blocked. You must use another form of birth control for these 3 months.  Female sterilization. This is when the female has the tubes that carry sperm tied off (vasectomy).This blocks sperm from entering the vagina during sexual intercourse. After the procedure, the man can still ejaculate fluid (semen). Natural planning methods  Natural family planning. This is not having sexual intercourse or using a barrier method (condom, diaphragm, cervical cap) on days the woman could become pregnant.  Calendar method. This is keeping track of the length of each menstrual cycle and identifying when you are fertile.  Ovulation method. This is avoiding sexual intercourse during ovulation.  Symptothermal method. This is avoiding sexual intercourse during ovulation, using a thermometer and ovulation symptoms.  Post-ovulation method. This is timing sexual intercourse after you have ovulated. Regardless of which type or method of contraception you choose, it is important that you use condoms to protect against the transmission of sexually transmitted infections (STIs). Talk with your health care provider about which form of contraception is most appropriate for you. This information is not intended to replace advice given to you by your health care provider. Make sure you discuss any questions you have with your health care provider. Document Released:  08/10/2005 Document Revised: 01/16/2016 Document Reviewed: 02/02/2013 [employer] Interactive Patient Education  2017 [employer]   Breastfeeding Deciding to breastfeed is one of the best choices you can make for you and your baby. A change in hormones during pregnancy causes your breast tissue to grow and increases the number and size of your milk ducts. These hormones also allow proteins, sugars, and fats from your blood supply to make breast milk in your milk-producing glands. Hormones prevent breast milk from being released before your baby is born as well as prompt milk flow after birth. Once breastfeeding has begun, thoughts of your baby, as well as his or her sucking or crying, can stimulate the release of milk from your milk-producing glands. Benefits of breastfeeding For Your Baby  Your first milk (colostrum) helps your baby's digestive system function better.  There are antibodies in your milk that help your baby fight off infections.  Your baby has a lower incidence of asthma, allergies, and sudden infant death syndrome.  The nutrients in breast milk are better for your baby than infant formulas and are designed uniquely for your baby's needs.  Breast milk improves your baby's brain development.  Your baby is less likely to develop other conditions, such as childhood obesity, asthma, or type 2 diabetes mellitus.  For You  Breastfeeding helps to create a very special bond between you and your baby.  Breastfeeding is convenient. Breast milk is always available at the correct temperature and costs nothing.  Breastfeeding helps to burn calories and helps you lose the weight gained during pregnancy.  Breastfeeding makes your uterus contract to its prepregnancy size faster and slows bleeding (lochia) after you give birth.  Breastfeeding helps to lower your risk of developing type 2 diabetes mellitus, osteoporosis, and breast or ovarian cancer later in life.  Signs that your baby  is hungry Early Signs of Hunger  Increased alertness or activity.  Stretching.  Movement of the head from side to side.  Movement of the head and opening of the mouth when the corner of the mouth or cheek is stroked (rooting).  Increased sucking sounds, smacking lips, cooing, sighing, or   squeaking.  Hand-to-mouth movements.  Increased sucking of fingers or hands.  Late Signs of Hunger  Fussing.  Intermittent crying.  Extreme Signs of Hunger Signs of extreme hunger will require calming and consoling before your baby will be able to breastfeed successfully. Do not wait for the following signs of extreme hunger to occur before you initiate breastfeeding:  Restlessness.  A loud, strong cry.  Screaming.  Breastfeeding basics Breastfeeding Initiation  Find a comfortable place to sit or lie down, with your neck and back well supported.  Place a pillow or rolled up blanket under your baby to bring him or her to the level of your breast (if you are seated). Nursing pillows are specially designed to help support your arms and your baby while you breastfeed.  Make sure that your baby's abdomen is facing your abdomen.  Gently massage your breast. With your fingertips, massage from your chest wall toward your nipple in a circular motion. This encourages milk flow. You may need to continue this action during the feeding if your milk flows slowly.  Support your breast with 4 fingers underneath and your thumb above your nipple. Make sure your fingers are well away from your nipple and your baby's mouth.  Stroke your baby's lips gently with your finger or nipple.  When your baby's mouth is open wide enough, quickly bring your baby to your breast, placing your entire nipple and as much of the colored area around your nipple (areola) as possible into your baby's mouth. ? More areola should be visible above your baby's upper lip than below the lower lip. ? Your baby's tongue should be  between his or her lower gum and your breast.  Ensure that your baby's mouth is correctly positioned around your nipple (latched). Your baby's lips should create a seal on your breast and be turned out (everted).  It is common for your baby to suck about 2-3 minutes in order to start the flow of breast milk.  Latching Teaching your baby how to latch on to your breast properly is very important. An improper latch can cause nipple pain and decreased milk supply for you and poor weight gain in your baby. Also, if your baby is not latched onto your nipple properly, he or she may swallow some air during feeding. This can make your baby fussy. Burping your baby when you switch breasts during the feeding can help to get rid of the air. However, teaching your baby to latch on properly is still the best way to prevent fussiness from swallowing air while breastfeeding. Signs that your baby has successfully latched on to your nipple:  Silent tugging or silent sucking, without causing you pain.  Swallowing heard between every 3-4 sucks.  Muscle movement above and in front of his or her ears while sucking.  Signs that your baby has not successfully latched on to nipple:  Sucking sounds or smacking sounds from your baby while breastfeeding.  Nipple pain.  If you think your baby has not latched on correctly, slip your finger into the corner of your baby's mouth to break the suction and place it between your baby's gums. Attempt breastfeeding initiation again. Signs of Successful Breastfeeding Signs from your baby:  A gradual decrease in the number of sucks or complete cessation of sucking.  Falling asleep.  Relaxation of his or her body.  Retention of a small amount of milk in his or her mouth.  Letting go of your breast by himself or herself.    Signs from you:  Breasts that have increased in firmness, weight, and size 1-3 hours after feeding.  Breasts that are softer immediately after  breastfeeding.  Increased milk volume, as well as a change in milk consistency and color by the fifth day of breastfeeding.  Nipples that are not sore, cracked, or bleeding.  Signs That Your Baby is Getting Enough Milk  Wetting at least 1-2 diapers during the first 24 hours after birth.  Wetting at least 5-6 diapers every 24 hours for the first week after birth. The urine should be clear or pale yellow by 5 days after birth.  Wetting 6-8 diapers every 24 hours as your baby continues to grow and develop.  At least 3 stools in a 24-hour period by age 5 days. The stool should be soft and yellow.  At least 3 stools in a 24-hour period by age 7 days. The stool should be seedy and yellow.  No loss of weight greater than 10% of birth weight during the first 3 days of age.  Average weight gain of 4-7 ounces (113-198 g) per week after age 4 days.  Consistent daily weight gain by age 5 days, without weight loss after the age of 2 weeks.  After a feeding, your baby may spit up a small amount. This is common. Breastfeeding frequency and duration Frequent feeding will help you make more milk and can prevent sore nipples and breast engorgement. Breastfeed when you feel the need to reduce the fullness of your breasts or when your baby shows signs of hunger. This is called "breastfeeding on demand." Avoid introducing a pacifier to your baby while you are working to establish breastfeeding (the first 4-6 weeks after your baby is born). After this time you may choose to use a pacifier. Research has shown that pacifier use during the first year of a baby's life decreases the risk of sudden infant death syndrome (SIDS). Allow your baby to feed on each breast as long as he or she wants. Breastfeed until your baby is finished feeding. When your baby unlatches or falls asleep while feeding from the first breast, offer the second breast. Because newborns are often sleepy in the first few weeks of life, you may  need to awaken your baby to get him or her to feed. Breastfeeding times will vary from baby to baby. However, the following rules can serve as a guide to help you ensure that your baby is properly fed:  Newborns (babies 4 weeks of age or younger) may breastfeed every 1-3 hours.  Newborns should not go longer than 3 hours during the day or 5 hours during the night without breastfeeding.  You should breastfeed your baby a minimum of 8 times in a 24-hour period until you begin to introduce solid foods to your baby at around 6 months of age.  Breast milk pumping Pumping and storing breast milk allows you to ensure that your baby is exclusively fed your breast milk, even at times when you are unable to breastfeed. This is especially important if you are going back to work while you are still breastfeeding or when you are not able to be present during feedings. Your lactation consultant can give you guidelines on how long it is safe to store breast milk. A breast pump is a machine that allows you to pump milk from your breast into a sterile bottle. The pumped breast milk can then be stored in a refrigerator or freezer. Some breast pumps are operated by   hand, while others use electricity. Ask your lactation consultant which type will work best for you. Breast pumps can be purchased, but some hospitals and breastfeeding support groups lease breast pumps on a monthly basis. A lactation consultant can teach you how to hand express breast milk, if you prefer not to use a pump. Caring for your breasts while you breastfeed Nipples can become dry, cracked, and sore while breastfeeding. The following recommendations can help keep your breasts moisturized and healthy:  Avoid using soap on your nipples.  Wear a supportive bra. Although not required, special nursing bras and tank tops are designed to allow access to your breasts for breastfeeding without taking off your entire bra or top. Avoid wearing  underwire-style bras or extremely tight bras.  Air dry your nipples for 3-4minutes after each feeding.  Use only cotton bra pads to absorb leaked breast milk. Leaking of breast milk between feedings is normal.  Use lanolin on your nipples after breastfeeding. Lanolin helps to maintain your skin's normal moisture barrier. If you use pure lanolin, you do not need to wash it off before feeding your baby again. Pure lanolin is not toxic to your baby. You may also hand express a few drops of breast milk and gently massage that milk into your nipples and allow the milk to air dry.  In the first few weeks after giving birth, some women experience extremely full breasts (engorgement). Engorgement can make your breasts feel heavy, warm, and tender to the touch. Engorgement peaks within 3-5 days after you give birth. The following recommendations can help ease engorgement:  Completely empty your breasts while breastfeeding or pumping. You may want to start by applying warm, moist heat (in the shower or with warm water-soaked hand towels) just before feeding or pumping. This increases circulation and helps the milk flow. If your baby does not completely empty your breasts while breastfeeding, pump any extra milk after he or she is finished.  Wear a snug bra (nursing or regular) or tank top for 1-2 days to signal your body to slightly decrease milk production.  Apply ice packs to your breasts, unless this is too uncomfortable for you.  Make sure that your baby is latched on and positioned properly while breastfeeding.  If engorgement persists after 48 hours of following these recommendations, contact your health care provider or a lactation consultant. Overall health care recommendations while breastfeeding  Eat healthy foods. Alternate between meals and snacks, eating 3 of each per day. Because what you eat affects your breast milk, some of the foods may make your baby more irritable than usual. Avoid  eating these foods if you are sure that they are negatively affecting your baby.  Drink milk, fruit juice, and water to satisfy your thirst (about 10 glasses a day).  Rest often, relax, and continue to take your prenatal vitamins to prevent fatigue, stress, and anemia.  Continue breast self-awareness checks.  Avoid chewing and smoking tobacco. Chemicals from cigarettes that pass into breast milk and exposure to secondhand smoke may harm your baby.  Avoid alcohol and drug use, including marijuana. Some medicines that may be harmful to your baby can pass through breast milk. It is important to ask your health care provider before taking any medicine, including all over-the-counter and prescription medicine as well as vitamin and herbal supplements. It is possible to become pregnant while breastfeeding. If birth control is desired, ask your health care provider about options that will be safe for your baby. Contact   a health care provider if:  You feel like you want to stop breastfeeding or have become frustrated with breastfeeding.  You have painful breasts or nipples.  Your nipples are cracked or bleeding.  Your breasts are red, tender, or warm.  You have a swollen area on either breast.  You have a fever or chills.  You have nausea or vomiting.  You have drainage other than breast milk from your nipples.  Your breasts do not become full before feedings by the fifth day after you give birth.  You feel sad and depressed.  Your baby is too sleepy to eat well.  Your baby is having trouble sleeping.  Your baby is wetting less than 3 diapers in a 24-hour period.  Your baby has less than 3 stools in a 24-hour period.  Your baby's skin or the white part of his or her eyes becomes yellow.  Your baby is not gaining weight by 5 days of age. Get help right away if:  Your baby is overly tired (lethargic) and does not want to wake up and feed.  Your baby develops an unexplained  fever. This information is not intended to replace advice given to you by your health care provider. Make sure you discuss any questions you have with your health care provider. Document Released: 08/10/2005 Document Revised: 01/22/2016 Document Reviewed: 02/01/2013 Elsevier Interactive Patient Education  2017 [employer]  

## 2016-11-04 ENCOUNTER — Telehealth: Payer: Self-pay | Admitting: *Deleted

## 2016-11-04 ENCOUNTER — Other Ambulatory Visit: Payer: Self-pay | Admitting: Obstetrics and Gynecology

## 2016-11-04 ENCOUNTER — Encounter: Payer: Self-pay | Admitting: Obstetrics and Gynecology

## 2016-11-04 DIAGNOSIS — R8271 Bacteriuria: Secondary | ICD-10-CM | POA: Insufficient documentation

## 2016-11-04 LAB — CYTOLOGY - PAP
Adequacy: ABSENT
DIAGNOSIS: NEGATIVE

## 2016-11-04 LAB — OBSTETRIC PANEL, INCLUDING HIV
Antibody Screen: NEGATIVE
BASOS ABS: 0 10*3/uL (ref 0.0–0.2)
BASOS: 0 %
EOS (ABSOLUTE): 0 10*3/uL (ref 0.0–0.4)
Eos: 1 %
HEMATOCRIT: 37.2 % (ref 34.0–46.6)
HEP B S AG: NEGATIVE
HIV SCREEN 4TH GENERATION: NONREACTIVE
Hemoglobin: 12.2 g/dL (ref 11.1–15.9)
IMMATURE GRANS (ABS): 0 10*3/uL (ref 0.0–0.1)
Immature Granulocytes: 0 %
LYMPHS: 27 %
Lymphocytes Absolute: 1.5 10*3/uL (ref 0.7–3.1)
MCH: 28.6 pg (ref 26.6–33.0)
MCHC: 32.8 g/dL (ref 31.5–35.7)
MCV: 87 fL (ref 79–97)
MONOCYTES: 8 %
Monocytes Absolute: 0.5 10*3/uL (ref 0.1–0.9)
NEUTROS ABS: 3.6 10*3/uL (ref 1.4–7.0)
Neutrophils: 64 %
PLATELETS: 363 10*3/uL (ref 150–379)
RBC: 4.26 x10E6/uL (ref 3.77–5.28)
RDW: 15.7 % — ABNORMAL HIGH (ref 12.3–15.4)
RPR: NONREACTIVE
RUBELLA: 8.31 {index} (ref >0.99)
Rh Factor: POSITIVE
WBC: 5.6 10*3/uL (ref 3.4–10.8)

## 2016-11-04 LAB — HEMOGLOBINOPATHY EVALUATION
HGB C: 0 %
HGB S: 0 %
HGB VARIANT: 0 %
Hemoglobin A2 Quantitation: 2.8 % (ref 1.8–3.2)
Hemoglobin F Quantitation: 0 % (ref 0.0–2.0)
Hgb A: 97.2 % (ref 96.4–98.8)

## 2016-11-04 LAB — URINE CULTURE, OB REFLEX

## 2016-11-04 LAB — GC/CHLAMYDIA PROBE AMP (~~LOC~~) NOT AT ARMC
CHLAMYDIA, DNA PROBE: NEGATIVE
NEISSERIA GONORRHEA: NEGATIVE

## 2016-11-04 LAB — CULTURE, OB URINE

## 2016-11-04 MED ORDER — PENICILLIN V POTASSIUM 500 MG PO TABS: 500.0000 mg | ORAL_TABLET | Freq: Four times a day (QID) | ORAL | 0 refills | Status: DC

## 2016-11-04 NOTE — Telephone Encounter (Signed)
-----   Message from Catalina AntiguaPeggy Constant, MD sent at 11/04/2016  8:10 AM EDT ----- Please inform patient of UTI. Rx has been e-prescribed  Clinical cytogeneticisteggy

## 2016-11-04 NOTE — Telephone Encounter (Signed)
Pt made aware of results and provider has sent Rx. 

## 2016-11-14 LAB — CYSTIC FIBROSIS MUTATION 97: Interpretation: NOT DETECTED

## 2016-11-30 ENCOUNTER — Ambulatory Visit (INDEPENDENT_AMBULATORY_CARE_PROVIDER_SITE_OTHER): Payer: Commercial Managed Care - PPO | Admitting: Obstetrics and Gynecology

## 2016-11-30 VITALS — BP 120/86 | HR 85 | Wt 106.0 lb

## 2016-11-30 DIAGNOSIS — Z3400 Encounter for supervision of normal first pregnancy, unspecified trimester: Secondary | ICD-10-CM

## 2016-11-30 DIAGNOSIS — R8271 Bacteriuria: Secondary | ICD-10-CM

## 2016-11-30 DIAGNOSIS — Z3482 Encounter for supervision of other normal pregnancy, second trimester: Secondary | ICD-10-CM

## 2016-11-30 NOTE — Progress Notes (Signed)
Patient is in the office for ob visit, denies pain. 

## 2016-11-30 NOTE — Progress Notes (Signed)
   PRENATAL VISIT NOTE  Subjective:  Misty Mckenzie is a 26 y.o. G2P0010 at [redacted]w[redacted]d being seen today for ongoing prenatal care.  She is currently monitored for the following issues for this low-risk pregnancy and has Supervision of normal pregnancy, antepartum and GBS bacteriuria on her problem list.  Patient reports no complaints.  Contractions: Not present. Vag. Bleeding: None.  Movement: Present. Denies leaking of fluid.   The following portions of the patient's history were reviewed and updated as appropriate: allergies, current medications, past family history, past medical history, past social history, past surgical history and problem list. Problem list updated.  Objective:   Vitals:   11/30/16 0908  BP: 120/86  Pulse: 85  Weight: 106 lb (48.1 kg)    Fetal Status: Fetal Heart Rate (bpm): 152   Movement: Present     General:  Alert, oriented and cooperative. Patient is in no acute distress.  Skin: Skin is warm and dry. No rash noted.   Cardiovascular: Normal heart rate noted  Respiratory: Normal respiratory effort, no problems with respiration noted  Abdomen: Soft, gravid, appropriate for gestational age. Pain/Pressure: Absent     Pelvic:  Cervical exam deferred        Extremities: Normal range of motion.  Edema: None  Mental Status: Normal mood and affect. Normal behavior. Normal judgment and thought content.   Assessment and Plan:  Pregnancy: G2P0010 at [redacted]w[redacted]d  1. Supervision of normal first pregnancy, antepartum Patient is doing well without complaints Quad screen today Patient scheduled for anatomy ultrasound on 4/25 Patient is planning on a cruise in May- Zika virus precautions reviewed  2. GBS bacteriuria Will provide prophylaxis in labor  General obstetric precautions including but not limited to vaginal bleeding, contractions, leaking of fluid and fetal movement were reviewed in detail with the patient. Please refer to After Visit Summary for other counseling  recommendations.  Return in about 4 weeks (around 12/28/2016) for ROB.   Catalina Antigua, MD

## 2016-11-30 NOTE — Patient Instructions (Signed)

## 2016-12-03 LAB — AFP, QUAD SCREEN
DIA MOM VALUE: 3.34
DIA VALUE (EIA): 702.86 pg/mL
DSR (By Age)    1 IN: 1039
DSR (SECOND TRIMESTER) 1 IN: 1112
GESTATIONAL AGE AFP: 16.4 wk
MATERNAL AGE AT EDD: 24.8 a
MSAFP MOM: 1.69
MSAFP: 82.3 ng/mL
MSHCG Mom: 1.65
MSHCG: 74812 m[IU]/mL
OSB RISK: 3342
T18 (By Age): 1:4050 {titer}
Test Results:: NEGATIVE
UE3 MOM: 1.75
WEIGHT: 106 [lb_av]
uE3 Value: 1.67 ng/mL

## 2016-12-16 ENCOUNTER — Ambulatory Visit (HOSPITAL_COMMUNITY)
Admission: RE | Admit: 2016-12-16 | Discharge: 2016-12-16 | Disposition: A | Discharge status: Home / Self Care | Payer: Commercial Managed Care - PPO | Source: Ambulatory Visit | Attending: Obstetrics and Gynecology | Admitting: Obstetrics and Gynecology

## 2016-12-16 DIAGNOSIS — Z3A18 18 weeks gestation of pregnancy: Secondary | ICD-10-CM | POA: Diagnosis not present

## 2016-12-16 DIAGNOSIS — Z363 Encounter for antenatal screening for malformations: Secondary | ICD-10-CM | POA: Insufficient documentation

## 2016-12-16 DIAGNOSIS — Z3400 Encounter for supervision of normal first pregnancy, unspecified trimester: Secondary | ICD-10-CM

## 2017-01-04 ENCOUNTER — Encounter: Payer: Commercial Managed Care - PPO | Admitting: Obstetrics and Gynecology

## 2017-01-07 ENCOUNTER — Ambulatory Visit (INDEPENDENT_AMBULATORY_CARE_PROVIDER_SITE_OTHER): Payer: Commercial Managed Care - PPO | Admitting: Obstetrics

## 2017-01-07 ENCOUNTER — Encounter: Payer: Self-pay | Admitting: Obstetrics

## 2017-01-07 VITALS — BP 107/72 | HR 101 | Wt 114.0 lb

## 2017-01-07 DIAGNOSIS — Z3480 Encounter for supervision of other normal pregnancy, unspecified trimester: Secondary | ICD-10-CM

## 2017-01-07 DIAGNOSIS — Z3482 Encounter for supervision of other normal pregnancy, second trimester: Secondary | ICD-10-CM

## 2017-01-07 DIAGNOSIS — Z3400 Encounter for supervision of normal first pregnancy, unspecified trimester: Secondary | ICD-10-CM

## 2017-01-07 MED ORDER — PRENATE MINI 29-0.6-0.4-350 MG PO CAPS: 1.0000 | ORAL_CAPSULE | Freq: Every day | ORAL | 3 refills | Status: DC

## 2017-01-07 NOTE — Progress Notes (Signed)
Patient reports feeling fetal movements, denies pain/contractions and bleeding.

## 2017-01-08 ENCOUNTER — Encounter: Payer: Self-pay | Admitting: Obstetrics

## 2017-01-08 NOTE — Progress Notes (Signed)
Subjective:  Misty Mckenzie is a 26 y.o. G2P0010 at 1353w1d being seen today for ongoing prenatal care.  She is currently monitored for the following issues for this low-risk pregnancy and has Supervision of normal pregnancy, antepartum and GBS bacteriuria on her problem list.  Patient reports no complaints.  Contractions: Not present. Vag. Bleeding: None.  Movement: Present. Denies leaking of fluid.   The following portions of the patient's history were reviewed and updated as appropriate: allergies, current medications, past family history, past medical history, past social history, past surgical history and problem list. Problem list updated.  Objective:   Vitals:   01/07/17 1512  BP: 107/72  Pulse: (!) 101  Weight: 114 lb (51.7 kg)    Fetal Status: Fetal Heart Rate (bpm): 150   Movement: Present     General:  Alert, oriented and cooperative. Patient is in no acute distress.  Skin: Skin is warm and dry. No rash noted.   Cardiovascular: Normal heart rate noted  Respiratory: Normal respiratory effort, no problems with respiration noted  Abdomen: Soft, gravid, appropriate for gestational age. Pain/Pressure: Absent     Pelvic:  Cervical exam deferred        Extremities: Normal range of motion.  Edema: None  Mental Status: Normal mood and affect. Normal behavior. Normal judgment and thought content.   Urinalysis:      Assessment and Plan:  Pregnancy: G2P0010 at 4653w1d  1. Supervision of normal first pregnancy, antepartum Rx: - Prenat w/o A-[id number]-FA-DHA (PRENATE MINI) 29-0.6-0.4-350 MG CAPS; Take 1 capsule by mouth daily before breakfast.  Dispense: 90 capsule; Refill: 3  Preterm labor symptoms and general obstetric precautions including but not limited to vaginal bleeding, contractions, leaking of fluid and fetal movement were reviewed in detail with the patient. Please refer to After Visit Summary for other counseling recommendations.  Return in about 2 weeks (around 01/21/2017)  for ROB.   Brock BadHarper, Lauralynn Loeb A, MD  Patient ID: Misty LoftyAleah Filler, female   DOB: 05/26/1991, 26 y.o.   MRN: [id number]

## 2017-01-15 ENCOUNTER — Telehealth: Payer: Self-pay | Admitting: *Deleted

## 2017-01-15 ENCOUNTER — Other Ambulatory Visit: Payer: Self-pay | Admitting: *Deleted

## 2017-01-15 MED ORDER — CITRANATAL BLOOM 90-1 MG PO TABS: 1.0000 | ORAL_TABLET | Freq: Every day | ORAL | 11 refills | Status: AC

## 2017-01-15 NOTE — Telephone Encounter (Signed)
Patient called and is stating that her pharmacy is out of stock on her prenatal vitamin- can we send something different.

## 2017-01-26 ENCOUNTER — Telehealth: Payer: Self-pay | Admitting: *Deleted

## 2017-01-26 NOTE — Telephone Encounter (Signed)
Call came into office from [facility] regarding pt BP from last night. Reading were 130/93 with repeat of 133/97, states pt is not symptomatic. Reviewed with Dr Laural RoesHarper,he states borderline BP's. Advised to have pt keep appt and check BP then.  Call placed to pt today to see if BP readings still elevated. Pt [facility] that [facility]. Pt states she took shower and laid down a while then did retake. BP readings were then 123/73 and 112/66.  Pt advised to keep appt next week as scheduled.  Pt made aware if she were to have any symptoms, HA/vision changes with increase BP, to call office. Pt states understanding.

## 2017-02-04 ENCOUNTER — Ambulatory Visit (INDEPENDENT_AMBULATORY_CARE_PROVIDER_SITE_OTHER): Payer: Commercial Managed Care - PPO | Admitting: Obstetrics

## 2017-02-04 ENCOUNTER — Encounter: Payer: Self-pay | Admitting: Obstetrics

## 2017-02-04 VITALS — BP 126/83 | HR 95 | Wt 120.2 lb

## 2017-02-04 DIAGNOSIS — Z3492 Encounter for supervision of normal pregnancy, unspecified, second trimester: Secondary | ICD-10-CM

## 2017-02-04 DIAGNOSIS — Z3490 Encounter for supervision of normal pregnancy, unspecified, unspecified trimester: Secondary | ICD-10-CM

## 2017-02-04 NOTE — Progress Notes (Addendum)
Subjective:  Loel Loftyleah Bevens is a 26 y.o. G2P0010 at 3434w0d being seen today for ongoing prenatal care.  She is currently monitored for the following issues for this low-risk pregnancy and has Supervision of normal pregnancy, antepartum and GBS bacteriuria on her problem list.  Patient reports no complaints.  Contractions: Not present. Vag. Bleeding: None.  Movement: Present. Denies leaking of fluid.   The following portions of the patient's history were reviewed and updated as appropriate: allergies, current medications, past family history, past medical history, past social history, past surgical history and problem list. Problem list updated.  Objective:   Vitals:   02/04/17 0951  BP: 126/83  Pulse: 95  Weight: 120 lb 3.2 oz (54.5 kg)    Fetal Status: Fetal Heart Rate (bpm): 150   Movement: Present     General:  Alert, oriented and cooperative. Patient is in no acute distress.  Skin: Skin is warm and dry. No rash noted.   Cardiovascular: Normal heart rate noted  Respiratory: Normal respiratory effort, no problems with respiration noted  Abdomen: Soft, gravid, appropriate for gestational age. Pain/Pressure: Absent     Pelvic:  Cervical exam deferred        Extremities: Normal range of motion.  Edema: Trace  Mental Status: Normal mood and affect. Normal behavior. Normal judgment and thought content.   Urinalysis:      Assessment and Plan:  Pregnancy: G2P0010 at 6434w0d  1. Supervision of normal pregnancy, antepartum  There are no diagnoses linked to this encounter. Preterm labor symptoms and general obstetric precautions including but not limited to vaginal bleeding, contractions, leaking of fluid and fetal movement were reviewed in detail with the patient. Please refer to After Visit Summary for other counseling recommendations.  Return in about 2 weeks (around 02/18/2017) for ROB.   Brock BadHarper, Frankie Zito A, MDPatient ID: Loel LoftyAleah Krasinski, female   DOB: 02/09/1991, 26 y.o.   MRN:  [id number]

## 2017-02-04 NOTE — Progress Notes (Signed)
Patient has started swelling in her feet

## 2017-02-25 ENCOUNTER — Ambulatory Visit (INDEPENDENT_AMBULATORY_CARE_PROVIDER_SITE_OTHER): Payer: Commercial Managed Care - PPO | Admitting: Obstetrics & Gynecology

## 2017-02-25 ENCOUNTER — Other Ambulatory Visit: Payer: Commercial Managed Care - PPO

## 2017-02-25 DIAGNOSIS — Z23 Encounter for immunization: Secondary | ICD-10-CM | POA: Diagnosis not present

## 2017-02-25 DIAGNOSIS — Z3490 Encounter for supervision of normal pregnancy, unspecified, unspecified trimester: Secondary | ICD-10-CM

## 2017-02-25 DIAGNOSIS — Z3493 Encounter for supervision of normal pregnancy, unspecified, third trimester: Secondary | ICD-10-CM

## 2017-02-25 NOTE — Patient Instructions (Signed)
Third Trimester of Pregnancy The third trimester is from week 28 through week 40 (months 7 through 9). The third trimester is a time when the unborn baby (fetus) is growing rapidly. At the end of the ninth month, the fetus is about 20 inches in length and weighs 6-10 pounds. Body changes during your third trimester Your body will continue to go through many changes during pregnancy. The changes vary from woman to woman. During the third trimester:  Your weight will continue to increase. You can expect to gain 25-35 pounds (11-16 kg) by the end of the pregnancy.  You may begin to get stretch marks on your hips, abdomen, and breasts.  You may urinate more often because the fetus is moving lower into your pelvis and pressing on your bladder.  You may develop or continue to have heartburn. This is caused by increased hormones that slow down muscles in the digestive tract.  You may develop or continue to have constipation because increased hormones slow digestion and cause the muscles that push waste through your intestines to relax.  You may develop hemorrhoids. These are swollen veins (varicose veins) in the rectum that can itch or be painful.  You may develop swollen, bulging veins (varicose veins) in your legs.  You may have increased body aches in the pelvis, back, or thighs. This is due to weight gain and increased hormones that are relaxing your joints.  You may have changes in your hair. These can include thickening of your hair, rapid growth, and changes in texture. Some women also have hair loss during or after pregnancy, or hair that feels dry or thin. Your hair will most likely return to normal after your baby is born.  Your breasts will continue to grow and they will continue to become tender. A yellow fluid (colostrum) may leak from your breasts. This is the first milk you are producing for your baby.  Your belly button may stick out.  You may notice more swelling in your hands,  face, or ankles.  You may have increased tingling or numbness in your hands, arms, and legs. The skin on your belly may also feel numb.  You may feel short of breath because of your expanding uterus.  You may have more problems sleeping. This can be caused by the size of your belly, increased need to urinate, and an increase in your body's metabolism.  You may notice the fetus "dropping," or moving lower in your abdomen (lightening).  You may have increased vaginal discharge.  You may notice your joints feel loose and you may have pain around your pelvic bone.  What to expect at prenatal visits You will have prenatal exams every 2 weeks until week 36. Then you will have weekly prenatal exams. During a routine prenatal visit:  You will be weighed to make sure you and the baby are growing normally.  Your blood pressure will be taken.  Your abdomen will be measured to track your baby's growth.  The fetal heartbeat will be listened to.  Any test results from the previous visit will be discussed.  You may have a cervical check near your due date to see if your cervix has softened or thinned (effaced).  You will be tested for Group B streptococcus. This happens between 35 and 37 weeks.  Your health care provider may ask you:  What your birth plan is.  How you are feeling.  If you are feeling the baby move.  If you have had   any abnormal symptoms, such as leaking fluid, bleeding, severe headaches, or abdominal cramping.  If you are using any tobacco products, including cigarettes, chewing tobacco, and electronic cigarettes.  If you have any questions.  Other tests or screenings that may be performed during your third trimester include:  Blood tests that check for low iron levels (anemia).  Fetal testing to check the health, activity level, and growth of the fetus. Testing is done if you have certain medical conditions or if there are problems during the  pregnancy.  Nonstress test (NST). This test checks the health of your baby to make sure there are no signs of problems, such as the baby not getting enough oxygen. During this test, a belt is placed around your belly. The baby is made to move, and its heart rate is monitored during movement.  What is false labor? False labor is a condition in which you feel small, irregular tightenings of the muscles in the womb (contractions) that usually go away with rest, changing position, or drinking water. These are called Braxton Hicks contractions. Contractions may last for hours, days, or even weeks before true labor sets in. If contractions come at regular intervals, become more frequent, increase in intensity, or become painful, you should see your health care provider. What are the signs of labor?  Abdominal cramps.  Regular contractions that start at 10 minutes apart and become stronger and more frequent with time.  Contractions that start on the top of the uterus and spread down to the lower abdomen and back.  Increased pelvic pressure and dull back pain.  A watery or bloody mucus discharge that comes from the vagina.  Leaking of amniotic fluid. This is also known as your "water breaking." It could be a slow trickle or a gush. Let your health care provider know if it has a color or strange odor. If you have any of these signs, call your health care provider right away, even if it is before your due date. Follow these instructions at home: Medicines  Follow your health care provider's instructions regarding medicine use. Specific medicines may be either safe or unsafe to take during pregnancy.  Take a prenatal vitamin that contains at least 600 micrograms (mcg) of folic acid.  If you develop constipation, try taking a stool softener if your health care provider approves. Eating and drinking  Eat a balanced diet that includes fresh fruits and vegetables, whole grains, good sources of protein  such as meat, eggs, or tofu, and low-fat dairy. Your health care provider will help you determine the amount of weight gain that is right for you.  Avoid raw meat and uncooked cheese. These carry germs that can cause birth defects in the baby.  If you have low calcium intake from food, talk to your health care provider about whether you should take a daily calcium supplement.  Eat four or five small meals rather than three large meals a day.  Limit foods that are high in fat and processed sugars, such as fried and sweet foods.  To prevent constipation: ? Drink enough fluid to keep your urine clear or pale yellow. ? Eat foods that are high in fiber, such as fresh fruits and vegetables, whole grains, and beans. Activity  Exercise only as directed by your health care provider. Most women can continue their usual exercise routine during pregnancy. Try to exercise for 30 minutes at least 5 days a week. Stop exercising if you experience uterine contractions.  Avoid heavy   lifting.  Do not exercise in extreme heat or humidity, or at high altitudes.  Wear low-heel, comfortable shoes.  Practice good posture.  You may continue to have sex unless your health care provider tells you otherwise. Relieving pain and discomfort  Take frequent breaks and rest with your legs elevated if you have leg cramps or low back pain.  Take warm sitz baths to soothe any pain or discomfort caused by hemorrhoids. Use hemorrhoid cream if your health care provider approves.  Wear a good support bra to prevent discomfort from breast tenderness.  If you develop varicose veins: ? Wear support pantyhose or compression stockings as told by your healthcare provider. ? Elevate your feet for 15 minutes, 3-4 times a day. Prenatal care  Write down your questions. Take them to your prenatal visits.  Keep all your prenatal visits as told by your health care provider. This is important. Safety  Wear your seat belt at  all times when driving.  Make a list of emergency phone numbers, including numbers for family, friends, the hospital, and police and fire departments. General instructions  Avoid cat litter boxes and soil used by cats. These carry germs that can cause birth defects in the baby. If you have a cat, ask someone to clean the litter box for you.  Do not travel far distances unless it is absolutely necessary and only with the approval of your health care provider.  Do not use hot tubs, steam rooms, or saunas.  Do not drink alcohol.  Do not use any products that contain nicotine or tobacco, such as cigarettes and e-cigarettes. If you need help quitting, ask your health care provider.  Do not use any medicinal herbs or unprescribed drugs. These chemicals affect the formation and growth of the baby.  Do not douche or use tampons or scented sanitary pads.  Do not cross your legs for long periods of time.  To prepare for the arrival of your baby: ? Take prenatal classes to understand, practice, and ask questions about labor and delivery. ? Make a trial run to the hospital. ? Visit the hospital and tour the maternity area. ? Arrange for maternity or paternity leave through employers. ? Arrange for family and friends to take care of pets while you are in the hospital. ? Purchase a rear-facing car seat and make sure you know how to install it in your car. ? Pack your hospital bag. ? Prepare the baby's nursery. Make sure to remove all pillows and stuffed animals from the baby's crib to prevent suffocation.  Visit your dentist if you have not gone during your pregnancy. Use a soft toothbrush to brush your teeth and be gentle when you floss. Contact a health care provider if:  You are unsure if you are in labor or if your water has broken.  You become dizzy.  You have mild pelvic cramps, pelvic pressure, or nagging pain in your abdominal area.  You have lower back pain.  You have persistent  nausea, vomiting, or diarrhea.  You have an unusual or bad smelling vaginal discharge.  You have pain when you urinate. Get help right away if:  Your water breaks before 37 weeks.  You have regular contractions less than 5 minutes apart before 37 weeks.  You have a fever.  You are leaking fluid from your vagina.  You have spotting or bleeding from your vagina.  You have severe abdominal pain or cramping.  You have rapid weight loss or weight gain.    You have shortness of breath with chest pain.  You notice sudden or extreme swelling of your face, hands, ankles, feet, or legs.  Your baby makes fewer than 10 movements in 2 hours.  You have severe headaches that do not go away when you take medicine.  You have vision changes. Summary  The third trimester is from week 28 through week 40, months 7 through 9. The third trimester is a time when the unborn baby (fetus) is growing rapidly.  During the third trimester, your discomfort may increase as you and your baby continue to gain weight. You may have abdominal, leg, and back pain, sleeping problems, and an increased need to urinate.  During the third trimester your breasts will keep growing and they will continue to become tender. A yellow fluid (colostrum) may leak from your breasts. This is the first milk you are producing for your baby.  False labor is a condition in which you feel small, irregular tightenings of the muscles in the womb (contractions) that eventually go away. These are called Braxton Hicks contractions. Contractions may last for hours, days, or even weeks before true labor sets in.  Signs of labor can include: abdominal cramps; regular contractions that start at 10 minutes apart and become stronger and more frequent with time; watery or bloody mucus discharge that comes from the vagina; increased pelvic pressure and dull back pain; and leaking of amniotic fluid. This information is not intended to replace advice  given to you by your health care provider. Make sure you discuss any questions you have with your health care provider. Document Released: 08/04/2001 Document Revised: 01/16/2016 Document Reviewed: 10/11/2012 Elsevier Interactive Patient Education  2017 [employer]  

## 2017-02-25 NOTE — Progress Notes (Signed)
Pt c/o feet edema and R hand numbness only in the morning then goes away. Unable to void; pt drinking H20.

## 2017-02-25 NOTE — Progress Notes (Signed)
   PRENATAL VISIT NOTE  Subjective:  Misty Mckenzie is a 26 y.o. G2P0010 at 4070w0d being seen today for ongoing prenatal care.  She is currently monitored for the following issues for this low-risk pregnancy and has Supervision of normal pregnancy, antepartum and GBS bacteriuria on her problem list.  Patient reports feet swelling.  Contractions: Not present. Vag. Bleeding: None.  Movement: Present. Denies leaking of fluid.   The following portions of the patient's history were reviewed and updated as appropriate: allergies, current medications, past family history, past medical history, past social history, past surgical history and problem list. Problem list updated.  Objective:   Vitals:   02/25/17 0941  BP: 130/87  Pulse: 86  Weight: 126 lb (57.2 kg)    Fetal Status: Fetal Heart Rate (bpm): 145   Movement: Present     General:  Alert, oriented and cooperative. Patient is in no acute distress.  Skin: Skin is warm and dry. No rash noted.   Cardiovascular: Normal heart rate noted  Respiratory: Normal respiratory effort, no problems with respiration noted  Abdomen: Soft, gravid, appropriate for gestational age. Pain/Pressure: Absent     Pelvic:  Cervical exam deferred        Extremities: Normal range of motion.  Edema: Mild pitting, slight indentation  Mental Status: Normal mood and affect. Normal behavior. Normal judgment and thought content.   Assessment and Plan:  Pregnancy: G2P0010 at 3870w0d  1. Encounter for supervision of normal pregnancy, antepartum, unspecified gravidity  - Glucose Tolerance, 2 Hours w/1 Hour - CBC - HIV antibody (with reflex) - RPR  Preterm labor symptoms and general obstetric precautions including but not limited to vaginal bleeding, contractions, leaking of fluid and fetal movement were reviewed in detail with the patient. Please refer to After Visit Summary for other counseling recommendations.  Return in about 2 weeks (around 03/11/2017).   Scheryl DarterJames  Sahib Pella, MD

## 2017-02-26 LAB — CBC
Hematocrit: 37.6 % (ref 34.0–46.6)
Hemoglobin: 12.4 g/dL (ref 11.1–15.9)
MCH: 30 pg (ref 26.6–33.0)
MCHC: 33 g/dL (ref 31.5–35.7)
MCV: 91 fL (ref 79–97)
PLATELETS: 229 10*3/uL (ref 150–379)
RBC: 4.14 x10E6/uL (ref 3.77–5.28)
RDW: 14.7 % (ref 12.3–15.4)
WBC: 4.8 10*3/uL (ref 3.4–10.8)

## 2017-02-26 LAB — HIV ANTIBODY (ROUTINE TESTING W REFLEX): HIV SCREEN 4TH GENERATION: NONREACTIVE

## 2017-02-26 LAB — GLUCOSE TOLERANCE, 2 HOURS W/ 1HR
GLUCOSE, 1 HOUR: 112 mg/dL (ref 65–179)
Glucose, 2 hour: 100 mg/dL (ref 65–152)
Glucose, Fasting: 68 mg/dL (ref 65–91)

## 2017-02-26 LAB — RPR: RPR Ser Ql: NONREACTIVE

## 2017-03-04 ENCOUNTER — Ambulatory Visit (INDEPENDENT_AMBULATORY_CARE_PROVIDER_SITE_OTHER): Payer: Commercial Managed Care - PPO

## 2017-03-04 VITALS — BP 127/78 | HR 94

## 2017-03-04 DIAGNOSIS — Z0130 Encounter for examination of blood pressure without abnormal findings: Secondary | ICD-10-CM

## 2017-03-04 NOTE — Progress Notes (Signed)
Pt has some swelling in her feet +2 pitting.

## 2017-03-07 ENCOUNTER — Encounter (HOSPITAL_COMMUNITY): Payer: Self-pay

## 2017-03-07 ENCOUNTER — Inpatient Hospital Stay (HOSPITAL_COMMUNITY)
Admission: AD | Admit: 2017-03-07 | Discharge: 2017-03-20 | DRG: 766 | Disposition: A | Discharge status: Home / Self Care | Payer: Commercial Managed Care - PPO | Source: Ambulatory Visit | Attending: Obstetrics & Gynecology | Admitting: Obstetrics & Gynecology

## 2017-03-07 DIAGNOSIS — Z98891 History of uterine scar from previous surgery: Secondary | ICD-10-CM

## 2017-03-07 DIAGNOSIS — Z3A31 31 weeks gestation of pregnancy: Secondary | ICD-10-CM

## 2017-03-07 DIAGNOSIS — O326XX0 Maternal care for compound presentation, not applicable or unspecified: Secondary | ICD-10-CM | POA: Diagnosis present

## 2017-03-07 DIAGNOSIS — O0990 Supervision of high risk pregnancy, unspecified, unspecified trimester: Secondary | ICD-10-CM

## 2017-03-07 DIAGNOSIS — Z3A30 30 weeks gestation of pregnancy: Secondary | ICD-10-CM

## 2017-03-07 DIAGNOSIS — O1493 Unspecified pre-eclampsia, third trimester: Secondary | ICD-10-CM

## 2017-03-07 DIAGNOSIS — O1494 Unspecified pre-eclampsia, complicating childbirth: Secondary | ICD-10-CM | POA: Diagnosis present

## 2017-03-07 DIAGNOSIS — O1413 Severe pre-eclampsia, third trimester: Secondary | ICD-10-CM

## 2017-03-07 DIAGNOSIS — O99824 Streptococcus B carrier state complicating childbirth: Secondary | ICD-10-CM | POA: Diagnosis present

## 2017-03-07 DIAGNOSIS — O1414 Severe pre-eclampsia complicating childbirth: Principal | ICD-10-CM | POA: Diagnosis present

## 2017-03-07 DIAGNOSIS — O368390 Maternal care for abnormalities of the fetal heart rate or rhythm, unspecified trimester, not applicable or unspecified: Secondary | ICD-10-CM | POA: Diagnosis not present

## 2017-03-07 DIAGNOSIS — O1423 HELLP syndrome (HELLP), third trimester: Secondary | ICD-10-CM

## 2017-03-07 DIAGNOSIS — R8271 Bacteriuria: Secondary | ICD-10-CM | POA: Diagnosis present

## 2017-03-07 HISTORY — DX: Personal history of other complications of pregnancy, childbirth and the puerperium: Z87.59

## 2017-03-07 LAB — TYPE AND SCREEN
ABO/RH(D): B POS
Antibody Screen: NEGATIVE

## 2017-03-07 LAB — URINALYSIS, ROUTINE W REFLEX MICROSCOPIC
Bilirubin Urine: NEGATIVE
GLUCOSE, UA: NEGATIVE mg/dL
HGB URINE DIPSTICK: NEGATIVE
Ketones, ur: NEGATIVE mg/dL
LEUKOCYTES UA: NEGATIVE
NITRITE: NEGATIVE
PH: 7 (ref 5.0–8.0)
Protein, ur: >=300 mg/dL — AB
SPECIFIC GRAVITY, URINE: 1.012 (ref 1.005–1.030)

## 2017-03-07 LAB — CBC
HCT: 37.8 % (ref 36.0–46.0)
HEMOGLOBIN: 13.1 g/dL (ref 12.0–15.0)
MCH: 30.6 pg (ref 26.0–34.0)
MCHC: 34.7 g/dL (ref 30.0–36.0)
MCV: 88.3 fL (ref 78.0–100.0)
PLATELETS: 206 10*3/uL (ref 150–400)
RBC: 4.28 MIL/uL (ref 3.87–5.11)
RDW: 14 % (ref 11.5–15.5)
WBC: 8.1 10*3/uL (ref 4.0–10.5)

## 2017-03-07 LAB — COMPREHENSIVE METABOLIC PANEL
ALBUMIN: 2.8 g/dL — AB (ref 3.5–5.0)
ALK PHOS: 372 U/L — AB (ref 38–126)
ALT: 20 U/L (ref 14–54)
AST: 32 U/L (ref 15–41)
Anion gap: 8 (ref 5–15)
BUN: 8 mg/dL (ref 6–20)
CALCIUM: 8.8 mg/dL — AB (ref 8.9–10.3)
CO2: 22 mmol/L (ref 22–32)
CREATININE: 0.61 mg/dL (ref 0.44–1.00)
Chloride: 107 mmol/L (ref 101–111)
GFR calc non Af Amer: >60 mL/min (ref >60)
Glucose, Bld: 74 mg/dL (ref 65–99)
Potassium: 3.7 mmol/L (ref 3.5–5.1)
SODIUM: 137 mmol/L (ref 135–145)
Total Bilirubin: 0.9 mg/dL (ref 0.3–1.2)
Total Protein: 6.1 g/dL — ABNORMAL LOW (ref 6.5–8.1)

## 2017-03-07 LAB — PROTEIN / CREATININE RATIO, URINE
Creatinine, Urine: 204 mg/dL
Protein Creatinine Ratio: 2.75 mg/mg{Cre} — ABNORMAL HIGH (ref 0.00–0.15)
Total Protein, Urine: 561 mg/dL

## 2017-03-07 MED ORDER — HYDRALAZINE HCL 20 MG/ML IJ SOLN: 10.0000 mg | Freq: Once | INTRAMUSCULAR | Status: DC | PRN

## 2017-03-07 MED ORDER — MAGNESIUM SULFATE BOLUS VIA INFUSION
4.0000 g | Freq: Once | INTRAVENOUS | Status: AC
  Administered 2017-03-07: 4 g via INTRAVENOUS
  Filled 2017-03-07: qty 500

## 2017-03-07 MED ORDER — CALCIUM CARBONATE ANTACID 500 MG PO CHEW
2.0000 | CHEWABLE_TABLET | ORAL | Status: DC | PRN
  Administered 2017-03-13: 400 mg via ORAL
  Filled 2017-03-07: qty 2

## 2017-03-07 MED ORDER — LACTATED RINGERS IV SOLN
INTRAVENOUS | Status: DC
  Administered 2017-03-07 – 2017-03-12 (×4): via INTRAVENOUS

## 2017-03-07 MED ORDER — DOCUSATE SODIUM 100 MG PO CAPS
100.0000 mg | ORAL_CAPSULE | Freq: Every day | ORAL | Status: DC
Start: 2017-03-07 — End: 2017-03-17
  Administered 2017-03-07 – 2017-03-13 (×7): 100 mg via ORAL
  Filled 2017-03-07 (×8): qty 1

## 2017-03-07 MED ORDER — MAGNESIUM SULFATE 40 G IN LACTATED RINGERS - SIMPLE
2.0000 g/h | INTRAVENOUS | Status: DC
  Administered 2017-03-07: 2 g/h via INTRAVENOUS
  Filled 2017-03-07: qty 500
  Filled 2017-03-07: qty 40

## 2017-03-07 MED ORDER — ACETAMINOPHEN 325 MG PO TABS
650.0000 mg | ORAL_TABLET | ORAL | Status: DC | PRN
  Administered 2017-03-10 – 2017-03-13 (×3): 650 mg via ORAL
  Filled 2017-03-07 (×3): qty 2

## 2017-03-07 MED ORDER — BETAMETHASONE SOD PHOS & ACET 6 (3-3) MG/ML IJ SUSP
12.0000 mg | INTRAMUSCULAR | Status: AC
  Administered 2017-03-07 – 2017-03-08 (×2): 12 mg via INTRAMUSCULAR
  Filled 2017-03-07 (×2): qty 2

## 2017-03-07 MED ORDER — PRENATAL MULTIVITAMIN CH
1.0000 | ORAL_TABLET | Freq: Every day | ORAL | Status: DC
Start: 2017-03-07 — End: 2017-03-17
  Administered 2017-03-07 – 2017-03-16 (×10): 1 via ORAL
  Filled 2017-03-07 (×11): qty 1

## 2017-03-07 MED ORDER — LABETALOL HCL 5 MG/ML IV SOLN
20.0000 mg | INTRAVENOUS | Status: AC | PRN
  Administered 2017-03-09: 40 mg via INTRAVENOUS
  Administered 2017-03-09 – 2017-03-10 (×2): 20 mg via INTRAVENOUS
  Filled 2017-03-07 (×2): qty 4
  Filled 2017-03-07: qty 8

## 2017-03-07 MED ORDER — LACTATED RINGERS IV SOLN
INTRAVENOUS | Status: DC
  Administered 2017-03-07: 03:00:00 via INTRAVENOUS

## 2017-03-07 MED ORDER — ZOLPIDEM TARTRATE 5 MG PO TABS: 5.0000 mg | ORAL_TABLET | Freq: Every evening | ORAL | Status: DC | PRN

## 2017-03-07 MED ORDER — LABETALOL HCL 5 MG/ML IV SOLN
20.0000 mg | INTRAVENOUS | Status: DC | PRN
  Administered 2017-03-07: 20 mg via INTRAVENOUS
  Administered 2017-03-07: 40 mg via INTRAVENOUS
  Filled 2017-03-07: qty 8
  Filled 2017-03-07: qty 4

## 2017-03-07 NOTE — [facility] Note (Addendum)
Pt is aon the  Baby scrips program. Took her b/p and itially it was 158/106. She waited a little while took a shower 128/91 and on the other arm it was 132/86. Denies H/A  Or visual changes. Or any pain or cramping. Pt does have 2+ edema to both feet for several weeks now.

## 2017-03-07 NOTE — H&P (Signed)
Misty Mckenzie is a 26 y.o. female G2P0010 @30 .3 weeks presenting for Preeclampsia. She presented to [facility]U with epigastric tightening today that prompted her to check her BP and was found to be 160s/100s. Evaluation found severe range BPs and PCR of 2.75, other labs were nml. Her BPs were treated with IV Labetalol. Her pregnancy has been uncomplicated outside of a GBS carrier.   OB History    Gravida Para Term Preterm AB Living   2       1     SAB TAB Ectopic Multiple Live Births       1         Past Medical History:  Diagnosis Date  . Medical history non-contributory    Past Surgical History:  Procedure Laterality Date  . NO PAST SURGERIES     Family History: family history includes Cancer in her maternal grandmother; Diabetes in her maternal grandmother; Hypertension in her father. Social History:  reports that she has never smoked. She has never used smokeless tobacco. She reports that she does not drink alcohol or use drugs.     Maternal Diabetes: No Genetic Screening: Normal Maternal Ultrasounds/Referrals: Normal Fetal Ultrasounds or other Referrals:  None Maternal Substance Abuse:  No Significant Maternal Medications:  None Significant Maternal Lab Results:  Lab values include: Group B Strep positive Other Comments:  None  Review of Systems  Eyes: Negative for blurred vision, double vision and photophobia.  Cardiovascular: Negative for chest pain and orthopnea.  Gastrointestinal: Negative for abdominal pain.  Neurological: Negative for headaches.   Maternal Medical History:  Prenatal Complications - Diabetes: none.      Blood pressure (!) 156/100, pulse 72, temperature 97.9 F (36.6 C), resp. rate 18, last menstrual period 08/06/2016, SpO2 100 %, unknown if currently breastfeeding. Maternal Exam:  Uterine Assessment: Contraction strength is mild.  Contraction frequency is rare.   Abdomen: Patient reports no abdominal tenderness.   Fetal Exam Fetal Monitor  Review: Mode: ultrasound.   Baseline rate: 135.  Variability: moderate (6-25 bpm).   Pattern: accelerations present and no decelerations.    Fetal State Assessment: Category I - tracings are normal.     Physical Exam  Nursing note and vitals reviewed. Constitutional: She is oriented to person, place, and time. She appears well-developed and well-nourished. No distress.  HENT:  Head: Normocephalic and atraumatic.  Neck: Normal range of motion. Neck supple.  Cardiovascular: Normal rate.   Respiratory: Effort normal. No respiratory distress.  GI: Soft. She exhibits no distension. There is no tenderness.  gravid  Musculoskeletal: Normal range of motion. She exhibits edema (2+ LE, pitting).  Neurological: She is alert and oriented to person, place, and time.  3+ DTRs, no clonus  Skin: Skin is warm and dry.  Psychiatric: She has a normal mood and affect.    Prenatal labs: ABO, Rh: --/--/B POS (07/15 0300) Antibody: NEG (07/15 0300) Rubella: 8.31 (03/12 1436) RPR: Non Reactive (07/05 1135)  HBsAg: Negative (03/12 1436)  HIV: Non Reactive (07/05 1135)  GBS:     Assessment/Plan: 30.[redacted] weeks gestation Preeclampsia Admit to Ante Magnesium Sulfate BMZ NICU consult  MFM US tomorrow Management per MD   Donette LarryMelanie Bhambri, CNM 03/07/2017, 4:41 AM    OB Attending  As noted above. Severe PEC by BP's and elevated urine P/CR. No evidence of HELLP. Magnesium, BP medication as needed for BP control, BMZ for FLM and U/s for growth.  Nettie ElmMichael Ervin, MD OB/GYN Faculty

## 2017-03-08 ENCOUNTER — Inpatient Hospital Stay (HOSPITAL_COMMUNITY): Payer: Commercial Managed Care - PPO

## 2017-03-08 DIAGNOSIS — Z3A30 30 weeks gestation of pregnancy: Secondary | ICD-10-CM

## 2017-03-08 DIAGNOSIS — O1493 Unspecified pre-eclampsia, third trimester: Secondary | ICD-10-CM

## 2017-03-08 NOTE — Progress Notes (Signed)
[name], female   DOB: 1991/04/26, 26 y.o.   MRN: [id number] FACULTY PRACTICE ANTEPARTUM(COMPREHENSIVE) NOTE  Misty Mckenzie is a 26 y.o. G2P0010 with Estimated Date of Delivery: 05/13/17   By  LMP, midtrimester ultrasound [redacted]w[redacted]d  who is admitted for evaluation of pre eclampsia.    Fetal presentation is unsure. Length of Stay:  1  Days  Date of admission:03/07/2017  Subjective: Pt denies headache or blurry vision Patient reports the fetal movement as active. Patient reports uterine contraction  activity as none. Patient reports  vaginal bleeding as none. Patient describes fluid per vagina as None.  Vitals:  Blood pressure (!) 146/104, pulse 93, temperature 98.3 F (36.8 C), temperature source Oral, resp. rate 18, last menstrual period 08/06/2016, SpO2 100 %, unknown if currently breastfeeding. Vitals:   03/07/17 1938 03/08/17 0020 03/08/17 0412 03/08/17 0804  BP: (!) 153/101 131/71 130/79 (!) 146/104  Pulse: (!) 101 93 92 93  Resp: 17 18 17 18   Temp: 98.4 F (36.9 C) 98 F (36.7 C) 98.2 F (36.8 C) 98.3 F (36.8 C)  TempSrc: Oral  Oral Oral  SpO2: 100% 100% 100% 100%   Physical Examination:  General appearance - alert, well appearing, and in no distress Abdomen - soft, nontender, nondistended, no masses or organomegaly Fundal Height:  size equals dates Pelvic Exam:  deferred. Extremities: extremities normal, atraumatic, no cyanosis or edema with DTRs 2+ bilaterally Membranes:intact  Fetal Monitoring:  Reactive NST   FHR 140s  Labs:  Results for orders placed or performed during the hospital encounter of 03/07/17 (from the past 168 hour(s))  Comprehensive metabolic panel   Collection Time: 03/07/17  3:00 AM  Result Value Ref Range   Sodium 137 135 - 145 mmol/L   Potassium 3.7 3.5 - 5.1 mmol/L   Chloride 107 101 - 111 mmol/L   CO2 22 22 - 32 mmol/L   Glucose, Bld 74 65 - 99 mg/dL   BUN 8 6 - 20 mg/dL   Creatinine, Ser 4.09 0.44 - 1.00 mg/dL   Calcium  8.8 (L) 8.9 - 10.3 mg/dL   Total Protein 6.1 (L) 6.5 - 8.1 g/dL   Albumin 2.8 (L) 3.5 - 5.0 g/dL   AST 32 15 - 41 U/L   ALT 20 14 - 54 U/L   Alkaline Phosphatase 372 (Mckenzie) 38 - 126 U/L   Total Bilirubin 0.9 0.3 - 1.2 mg/dL   GFR calc non Af Amer >60 >60 mL/min   GFR calc Af Amer >60 >60 mL/min   Anion gap 8 5 - 15  CBC   Collection Time: 03/07/17  3:00 AM  Result Value Ref Range   WBC 8.1 4.0 - 10.5 K/uL   RBC 4.28 3.87 - 5.11 MIL/uL   Hemoglobin 13.1 12.0 - 15.0 g/dL   HCT 81.1 91.4 - 78.2 %   MCV 88.3 78.0 - 100.0 fL   MCH 30.6 26.0 - 34.0 pg   MCHC 34.7 30.0 - 36.0 g/dL   RDW 95.6 21.3 - 08.6 %   Platelets 206 150 - 400 K/uL  Type and screen [facility]   Collection Time: 03/07/17  3:00 AM  Result Value Ref Range   ABO/RH(D) B POS    Antibody Screen NEG    Sample Expiration 03/10/2017   Urinalysis, Routine w reflex microscopic   Collection Time: 03/07/17  3:10 AM  Result Value Ref Range   Color, Urine YELLOW YELLOW   APPearance HAZY (A) CLEAR  Specific Gravity, Urine 1.012 1.005 - 1.030   pH 7.0 5.0 - 8.0   Glucose, UA NEGATIVE NEGATIVE mg/dL   Hgb urine dipstick NEGATIVE NEGATIVE   Bilirubin Urine NEGATIVE NEGATIVE   Ketones, ur NEGATIVE NEGATIVE mg/dL   Protein, ur >=409>=300 (A) NEGATIVE mg/dL   Nitrite NEGATIVE NEGATIVE   Leukocytes, UA NEGATIVE NEGATIVE   RBC / HPF 0-5 0 - 5 RBC/hpf   WBC, UA 6-30 0 - 5 WBC/hpf   Bacteria, UA FEW (A) NONE SEEN   Squamous Epithelial / LPF 0-5 (A) NONE SEEN   Mucous PRESENT    Hyaline Casts, UA PRESENT   Protein / creatinine ratio, urine   Collection Time: 03/07/17  3:10 AM  Result Value Ref Range   Creatinine, Urine 204.00 mg/dL   Total Protein, Urine 561 mg/dL   Protein Creatinine Ratio 2.75 (Mckenzie) 0.00 - 0.15 mg/mg[Cre]     Imaging Studies:    Sonogram today Medications:  Scheduled . docusate sodium  100 mg Oral Daily  . prenatal multivitamin  1 tablet Oral Q1200   I have reviewed the patient's current  medications.  ASSESSMENT: G2P0010 5419w4d Estimated Date of Delivery: 05/13/17  Patient Active Problem List   Diagnosis Date Noted  . Preeclampsia, third trimester 03/07/2017  . GBS bacteriuria 11/04/2016  . Supervision of normal pregnancy, antepartum 11/02/2016    PLAN: Magnesium is stopped Betamethasone course is completed Sonogram today Begin 24 hour urine collection Repeat labs in am Cont in house observation, consider anti hypertensives if BP >160/105 range  Misty Mckenzie 03/08/2017,8:09 AM

## 2017-03-09 DIAGNOSIS — O1493 Unspecified pre-eclampsia, third trimester: Secondary | ICD-10-CM

## 2017-03-09 DIAGNOSIS — Z3A30 30 weeks gestation of pregnancy: Secondary | ICD-10-CM

## 2017-03-09 LAB — CBC WITH DIFFERENTIAL/PLATELET
BASOS ABS: 0 10*3/uL (ref 0.0–0.1)
BASOS PCT: 0 %
EOS ABS: 0 10*3/uL (ref 0.0–0.7)
Eosinophils Relative: 0 %
HCT: 31.4 % — ABNORMAL LOW (ref 36.0–46.0)
HEMOGLOBIN: 10.9 g/dL — AB (ref 12.0–15.0)
Lymphocytes Relative: 13 %
Lymphs Abs: 1.5 10*3/uL (ref 0.7–4.0)
MCH: 30.4 pg (ref 26.0–34.0)
MCHC: 34.7 g/dL (ref 30.0–36.0)
MCV: 87.7 fL (ref 78.0–100.0)
Monocytes Absolute: 0.6 10*3/uL (ref 0.1–1.0)
Monocytes Relative: 5 %
NEUTROS PCT: 82 %
Neutro Abs: 9.6 10*3/uL — ABNORMAL HIGH (ref 1.7–7.7)
Platelets: 153 10*3/uL (ref 150–400)
RBC: 3.58 MIL/uL — AB (ref 3.87–5.11)
RDW: 14.2 % (ref 11.5–15.5)
WBC: 11.6 10*3/uL — AB (ref 4.0–10.5)

## 2017-03-09 LAB — PROTEIN, URINE, 24 HOUR
Collection Interval-UPROT: 24 hours
PROTEIN, URINE: 451 mg/dL
Protein, 24H Urine: 3608 mg/d — ABNORMAL HIGH (ref 50–100)
URINE TOTAL VOLUME-UPROT: 800 mL

## 2017-03-09 LAB — CREATININE CLEARANCE, URINE, 24 HOUR
CREATININE, URINE: 156.48 mg/dL
Collection Interval-CRCL: 24 hours
Creatinine Clearance: 155 mL/min — ABNORMAL HIGH (ref 75–115)
Creatinine, 24H Ur: 1252 mg/d (ref 600–1800)
Urine Total Volume-CRCL: 800 mL

## 2017-03-09 LAB — COMPREHENSIVE METABOLIC PANEL
ALBUMIN: 2.2 g/dL — AB (ref 3.5–5.0)
ALK PHOS: 258 U/L — AB (ref 38–126)
ALT: 17 U/L (ref 14–54)
ALT: 21 U/L (ref 14–54)
ANION GAP: 7 (ref 5–15)
AST: 28 U/L (ref 15–41)
AST: 38 U/L (ref 15–41)
Albumin: 2.2 g/dL — ABNORMAL LOW (ref 3.5–5.0)
Alkaline Phosphatase: 241 U/L — ABNORMAL HIGH (ref 38–126)
Anion gap: 6 (ref 5–15)
BILIRUBIN TOTAL: 0.1 mg/dL — AB (ref 0.3–1.2)
BUN: 11 mg/dL (ref 6–20)
BUN: 11 mg/dL (ref 6–20)
CALCIUM: 7.7 mg/dL — AB (ref 8.9–10.3)
CO2: 21 mmol/L — AB (ref 22–32)
CO2: 22 mmol/L (ref 22–32)
CREATININE: 0.56 mg/dL (ref 0.44–1.00)
Calcium: 7.7 mg/dL — ABNORMAL LOW (ref 8.9–10.3)
Chloride: 108 mmol/L (ref 101–111)
Chloride: 108 mmol/L (ref 101–111)
Creatinine, Ser: 0.62 mg/dL (ref 0.44–1.00)
GFR calc Af Amer: >60 mL/min (ref >60)
GFR calc Af Amer: >60 mL/min (ref >60)
GFR calc non Af Amer: >60 mL/min (ref >60)
GLUCOSE: 94 mg/dL (ref 65–99)
Glucose, Bld: 84 mg/dL (ref 65–99)
POTASSIUM: 4.1 mmol/L (ref 3.5–5.1)
Potassium: 4.4 mmol/L (ref 3.5–5.1)
SODIUM: 136 mmol/L (ref 135–145)
Sodium: 136 mmol/L (ref 135–145)
TOTAL PROTEIN: 5 g/dL — AB (ref 6.5–8.1)
Total Bilirubin: 0.3 mg/dL (ref 0.3–1.2)
Total Protein: 5.4 g/dL — ABNORMAL LOW (ref 6.5–8.1)

## 2017-03-09 LAB — CBC
HEMATOCRIT: 29.9 % — AB (ref 36.0–46.0)
Hemoglobin: 10.3 g/dL — ABNORMAL LOW (ref 12.0–15.0)
MCH: 30.6 pg (ref 26.0–34.0)
MCHC: 34.4 g/dL (ref 30.0–36.0)
MCV: 88.7 fL (ref 78.0–100.0)
PLATELETS: 146 10*3/uL — AB (ref 150–400)
RBC: 3.37 MIL/uL — ABNORMAL LOW (ref 3.87–5.11)
RDW: 14.4 % (ref 11.5–15.5)
WBC: 9.2 10*3/uL (ref 4.0–10.5)

## 2017-03-09 LAB — RPR: RPR Ser Ql: NONREACTIVE

## 2017-03-09 NOTE — Progress Notes (Signed)
[name], female   DOB: [birth year], 26 y.o.   MRN: [id number]  FACULTY PRACTICE ANTEPARTUM NOTE  Misty Mckenzie is a 26 y.o. G2P0010 at [redacted]w[redacted]d  who is admitted for preeclampsia.   Fetal presentation is cephalic. Length of Stay:  2  Days  Subjective: No problems. Denies headache, blurred vision, abdominal pain. BP continues to increase, but not in severe range.  Patient reports good fetal movement.   She reports no uterine contractions She reports no bleeding  She reports no loss of fluid per vagina.  Vitals:  Blood pressure (!) 156/98, pulse 87, temperature 98.2 F (36.8 C), temperature source Oral, resp. rate 18, height 5\' 5"  (1.651 m), weight 133 lb (60.3 kg), last menstrual period 08/06/2016, SpO2 99 %, unknown if currently breastfeeding. Physical Examination:  General appearance - alert, well appearing, and in no distress Heart: regular rate, no murmur Lungs: clear to auscultation bilaterally, no wheezing.  Fundal Height:  size equals dates Extremities: extremities normal, atraumatic, no cyanosis or edema with DTRs 3+ bilaterally Membranes:intact  Fetal Monitoring:  Baseline: 145 bpm, Variability: Good {> 6 bpm), Accelerations: Non-reactive but appropriate for gestational age and Decelerations: Absent  Labs:  Results for orders placed or performed during the hospital encounter of 03/07/17 (from the past 24 hour(s))  CBC with Differential/Platelet   Collection Time: 03/09/17  5:01 AM  Result Value Ref Range   WBC 11.6 (H) 4.0 - 10.5 K/uL   RBC 3.58 (L) 3.87 - 5.11 MIL/uL   Hemoglobin 10.9 (L) 12.0 - 15.0 g/dL   HCT 40.9 (L) 81.1 - 91.4 %   MCV 87.7 78.0 - 100.0 fL   MCH 30.4 26.0 - 34.0 pg   MCHC 34.7 30.0 - 36.0 g/dL   RDW 78.2 95.6 - 21.3 %   Platelets 153 150 - 400 K/uL   Neutrophils Relative % 82 %   Neutro Abs 9.6 (H) 1.7 - 7.7 K/uL   Lymphocytes Relative 13 %   Lymphs Abs 1.5 0.7 - 4.0 K/uL   Monocytes Relative 5 %   Monocytes Absolute 0.6 0.1 -  1.0 K/uL   Eosinophils Relative 0 %   Eosinophils Absolute 0.0 0.0 - 0.7 K/uL   Basophils Relative 0 %   Basophils Absolute 0.0 0.0 - 0.1 K/uL  Comprehensive metabolic panel   Collection Time: 03/09/17  5:01 AM  Result Value Ref Range   Sodium 136 135 - 145 mmol/L   Potassium 4.4 3.5 - 5.1 mmol/L   Chloride 108 101 - 111 mmol/L   CO2 21 (L) 22 - 32 mmol/L   Glucose, Bld 94 65 - 99 mg/dL   BUN 11 6 - 20 mg/dL   Creatinine, Ser 0.86 0.44 - 1.00 mg/dL   Calcium 7.7 (L) 8.9 - 10.3 mg/dL   Total Protein 5.4 (L) 6.5 - 8.1 g/dL   Albumin 2.2 (L) 3.5 - 5.0 g/dL   AST 28 15 - 41 U/L   ALT 17 14 - 54 U/L   Alkaline Phosphatase 258 (H) 38 - 126 U/L   Total Bilirubin 0.3 0.3 - 1.2 mg/dL   GFR calc non Af Amer >60 >60 mL/min   GFR calc Af Amer >60 >60 mL/min   Anion gap 7 5 - 15    Imaging Studies:       Medications:  Scheduled . docusate sodium  100 mg Oral Daily  . prenatal multivitamin  1 tablet Oral Q1200   I have reviewed the patient's current medications.  ASSESSMENT: Active Problems:   Preeclampsia, third trimester   PLAN: 1. Preeclampsia without severe features  Continue frequent BPs  Recheck CBC, CMP this evening - Platelets have decreased from 209 to 150 and Hg dropped from 13.1 to 10.9. Continue to watch closely.  BP just below severe range - discussed that if she develops severe range BP, will need to stay until 34 weeks and deliver at 34 weeks.  Continue twice daily NST.  NST reactive x2 Continue routine antenatal care.   Levie HeritageStinson, Jamarria Real J, DO 03/09/2017,11:57 AM

## 2017-03-10 ENCOUNTER — Encounter: Payer: Commercial Managed Care - PPO | Admitting: Obstetrics & Gynecology

## 2017-03-10 LAB — COMPREHENSIVE METABOLIC PANEL
ALBUMIN: 2.1 g/dL — AB (ref 3.5–5.0)
ALK PHOS: 245 U/L — AB (ref 38–126)
ALT: 27 U/L (ref 14–54)
ANION GAP: 5 (ref 5–15)
AST: 43 U/L — ABNORMAL HIGH (ref 15–41)
BUN: 12 mg/dL (ref 6–20)
CALCIUM: 7.7 mg/dL — AB (ref 8.9–10.3)
CHLORIDE: 111 mmol/L (ref 101–111)
CO2: 22 mmol/L (ref 22–32)
Creatinine, Ser: 0.6 mg/dL (ref 0.44–1.00)
GFR calc non Af Amer: >60 mL/min (ref >60)
GLUCOSE: 77 mg/dL (ref 65–99)
Potassium: 4.2 mmol/L (ref 3.5–5.1)
SODIUM: 138 mmol/L (ref 135–145)
Total Bilirubin: 0.2 mg/dL — ABNORMAL LOW (ref 0.3–1.2)
Total Protein: 5.1 g/dL — ABNORMAL LOW (ref 6.5–8.1)

## 2017-03-10 LAB — CBC
HCT: 32 % — ABNORMAL LOW (ref 36.0–46.0)
HEMOGLOBIN: 10.8 g/dL — AB (ref 12.0–15.0)
MCH: 30 pg (ref 26.0–34.0)
MCHC: 33.8 g/dL (ref 30.0–36.0)
MCV: 88.9 fL (ref 78.0–100.0)
PLATELETS: 172 10*3/uL (ref 150–400)
RBC: 3.6 MIL/uL — AB (ref 3.87–5.11)
RDW: 14.3 % (ref 11.5–15.5)
WBC: 9.1 10*3/uL (ref 4.0–10.5)

## 2017-03-10 LAB — TYPE AND SCREEN
ABO/RH(D): B POS
Antibody Screen: NEGATIVE

## 2017-03-10 MED ORDER — LABETALOL HCL 200 MG PO TABS
400.0000 mg | ORAL_TABLET | Freq: Once | ORAL | Status: AC
  Administered 2017-03-10: 400 mg via ORAL
  Filled 2017-03-10: qty 2

## 2017-03-10 NOTE — Progress Notes (Addendum)
[name], female   DOB: 03-08-1991, 26 y.o.   MRN: [id number]  FACULTY PRACTICE ANTEPARTUM NOTE  Misty Mckenzie is a 26 y.o. G2P0010 at [redacted]w[redacted]d  who is admitted for preeclampsia with severe features.   Fetal presentation is cephalic. Length of Stay:  3  Days  Subjective: Pt without complaint - denies headache, blurred vision, edema. Feels fine. Had severe Patient reports good fetal movement.   She reports no uterine contractions She reports no bleeding  She reports no loss of fluid per vagina.  Vitals:  Blood pressure (!) 158/98, pulse 72, temperature 98 F (36.7 C), temperature source Oral, resp. rate 18, height 5\' 5"  (1.651 m), weight 133 lb (60.3 kg), last menstrual period 08/06/2016, SpO2 96 %, unknown if currently breastfeeding. Physical Examination:  General appearance - alert, well appearing, and in no distress Chest - clear to auscultation, no wheezes, rales or rhonchi, symmetric air entry Heart - normal rate, regular rhythm, normal S1, S2, no murmurs, rubs, clicks or gallops Abdomen - soft, nontender, nondistended, no masses or organomegaly Fundal Height:  size equals dates Extremities: extremities normal, atraumatic, no cyanosis or edema and Homans sign is negative, no sign of DVT with DTRs 2+ bilaterally Membranes:intact  Fetal Monitoring:  Baseline: 130s bpm, Variability: Good {> 6 bpm), Accelerations: Non-reactive but appropriate for gestational age and Decelerations: Absent  Labs:  Results for orders placed or performed during the hospital encounter of 03/07/17 (from the past 24 hour(s))  Comprehensive metabolic panel   Collection Time: 03/09/17  6:11 PM  Result Value Ref Range   Sodium 136 135 - 145 mmol/L   Potassium 4.1 3.5 - 5.1 mmol/L   Chloride 108 101 - 111 mmol/L   CO2 22 22 - 32 mmol/L   Glucose, Bld 84 65 - 99 mg/dL   BUN 11 6 - 20 mg/dL   Creatinine, Ser 7.82 0.44 - 1.00 mg/dL   Calcium 7.7 (L) 8.9 - 10.3 mg/dL   Total Protein 5.0 (L)  6.5 - 8.1 g/dL   Albumin 2.2 (L) 3.5 - 5.0 g/dL   AST 38 15 - 41 U/L   ALT 21 14 - 54 U/L   Alkaline Phosphatase 241 (H) 38 - 126 U/L   Total Bilirubin 0.1 (L) 0.3 - 1.2 mg/dL   GFR calc non Af Amer >60 >60 mL/min   GFR calc Af Amer >60 >60 mL/min   Anion gap 6 5 - 15  CBC   Collection Time: 03/09/17  6:11 PM  Result Value Ref Range   WBC 9.2 4.0 - 10.5 K/uL   RBC 3.37 (L) 3.87 - 5.11 MIL/uL   Hemoglobin 10.3 (L) 12.0 - 15.0 g/dL   HCT 95.6 (L) 21.3 - 08.6 %   MCV 88.7 78.0 - 100.0 fL   MCH 30.6 26.0 - 34.0 pg   MCHC 34.4 30.0 - 36.0 g/dL   RDW 57.8 46.9 - 62.9 %   Platelets 146 (L) 150 - 400 K/uL  CBC   Collection Time: 03/10/17  5:31 AM  Result Value Ref Range   WBC 9.1 4.0 - 10.5 K/uL   RBC 3.60 (L) 3.87 - 5.11 MIL/uL   Hemoglobin 10.8 (L) 12.0 - 15.0 g/dL   HCT 52.8 (L) 41.3 - 24.4 %   MCV 88.9 78.0 - 100.0 fL   MCH 30.0 26.0 - 34.0 pg   MCHC 33.8 30.0 - 36.0 g/dL   RDW 01.0 27.2 - 53.6 %   Platelets 172 150 - 400  K/uL  Comprehensive metabolic panel   Collection Time: 03/10/17  5:31 AM  Result Value Ref Range   Sodium 138 135 - 145 mmol/L   Potassium 4.2 3.5 - 5.1 mmol/L   Chloride 111 101 - 111 mmol/L   CO2 22 22 - 32 mmol/L   Glucose, Bld 77 65 - 99 mg/dL   BUN 12 6 - 20 mg/dL   Creatinine, Ser 8.110.60 0.44 - 1.00 mg/dL   Calcium 7.7 (L) 8.9 - 10.3 mg/dL   Total Protein 5.1 (L) 6.5 - 8.1 g/dL   Albumin 2.1 (L) 3.5 - 5.0 g/dL   AST 43 (H) 15 - 41 U/L   ALT 27 14 - 54 U/L   Alkaline Phosphatase 245 (H) 38 - 126 U/L   Total Bilirubin 0.2 (L) 0.3 - 1.2 mg/dL   GFR calc non Af Amer >60 >60 mL/min   GFR calc Af Amer >60 >60 mL/min   Anion gap 5 5 - 15    Imaging Studies:       Medications:  Scheduled . docusate sodium  100 mg Oral Daily  . prenatal multivitamin  1 tablet Oral Q1200   I have reviewed the patient's current medications.  ASSESSMENT: Active Problems:   Preeclampsia, third trimester   PLAN: 1. Preeclampsia with severe features  Continue  BP management - not consistently in severe range, not on antihypertensives yet  LFTs minimally elevated  Recheck labs in 2 days  Continue twice daily NST.  NST reactive x3  Discussed delivery at 34 weeks.  Continue routine antenatal care.   Misty Mckenzie, Jacob J, DO 03/10/2017,6:55 AM

## 2017-03-11 ENCOUNTER — Inpatient Hospital Stay (HOSPITAL_COMMUNITY): Payer: Commercial Managed Care - PPO

## 2017-03-11 DIAGNOSIS — Z3493 Encounter for supervision of normal pregnancy, unspecified, third trimester: Secondary | ICD-10-CM

## 2017-03-11 LAB — COMPREHENSIVE METABOLIC PANEL
ALBUMIN: 2.1 g/dL — AB (ref 3.5–5.0)
ALK PHOS: 255 U/L — AB (ref 38–126)
ALT: 37 U/L (ref 14–54)
ALT: 36 U/L (ref 14–54)
ALT: 32 U/L (ref 14–54)
ANION GAP: 7 (ref 5–15)
ANION GAP: 7 (ref 5–15)
AST: 56 U/L — ABNORMAL HIGH (ref 15–41)
AST: 56 U/L — ABNORMAL HIGH (ref 15–41)
AST: 50 U/L — AB (ref 15–41)
Albumin: 2.1 g/dL — ABNORMAL LOW (ref 3.5–5.0)
Albumin: 2 g/dL — ABNORMAL LOW (ref 3.5–5.0)
Alkaline Phosphatase: 246 U/L — ABNORMAL HIGH (ref 38–126)
Alkaline Phosphatase: 244 U/L — ABNORMAL HIGH (ref 38–126)
Anion gap: 5 (ref 5–15)
BILIRUBIN TOTAL: 0.5 mg/dL (ref 0.3–1.2)
BUN: 13 mg/dL (ref 6–20)
BUN: 14 mg/dL (ref 6–20)
BUN: 14 mg/dL (ref 6–20)
CALCIUM: 7.5 mg/dL — AB (ref 8.9–10.3)
CHLORIDE: 108 mmol/L (ref 101–111)
CHLORIDE: 108 mmol/L (ref 101–111)
CO2: 22 mmol/L (ref 22–32)
CO2: 20 mmol/L — ABNORMAL LOW (ref 22–32)
CO2: 20 mmol/L — ABNORMAL LOW (ref 22–32)
CREATININE: 0.73 mg/dL (ref 0.44–1.00)
Calcium: 7.6 mg/dL — ABNORMAL LOW (ref 8.9–10.3)
Calcium: 7.5 mg/dL — ABNORMAL LOW (ref 8.9–10.3)
Chloride: 107 mmol/L (ref 101–111)
Creatinine, Ser: 0.59 mg/dL (ref 0.44–1.00)
Creatinine, Ser: 0.65 mg/dL (ref 0.44–1.00)
GFR calc Af Amer: >60 mL/min (ref >60)
GFR calc non Af Amer: >60 mL/min (ref >60)
GLUCOSE: 109 mg/dL — AB (ref 65–99)
Glucose, Bld: 66 mg/dL (ref 65–99)
Glucose, Bld: 102 mg/dL — ABNORMAL HIGH (ref 65–99)
POTASSIUM: 4.4 mmol/L (ref 3.5–5.1)
POTASSIUM: 4.3 mmol/L (ref 3.5–5.1)
Potassium: 4.3 mmol/L (ref 3.5–5.1)
Sodium: 135 mmol/L (ref 135–145)
Sodium: 134 mmol/L — ABNORMAL LOW (ref 135–145)
Sodium: 135 mmol/L (ref 135–145)
TOTAL PROTEIN: 5.1 g/dL — AB (ref 6.5–8.1)
TOTAL PROTEIN: 4.9 g/dL — AB (ref 6.5–8.1)
Total Bilirubin: 0.5 mg/dL (ref 0.3–1.2)
Total Bilirubin: 0.5 mg/dL (ref 0.3–1.2)
Total Protein: 5 g/dL — ABNORMAL LOW (ref 6.5–8.1)

## 2017-03-11 LAB — CBC
HCT: 33.3 % — ABNORMAL LOW (ref 36.0–46.0)
HEMATOCRIT: 32.6 % — AB (ref 36.0–46.0)
HEMATOCRIT: 30.5 % — AB (ref 36.0–46.0)
HEMOGLOBIN: 10.5 g/dL — AB (ref 12.0–15.0)
Hemoglobin: 11.4 g/dL — ABNORMAL LOW (ref 12.0–15.0)
Hemoglobin: 11.1 g/dL — ABNORMAL LOW (ref 12.0–15.0)
MCH: 30.2 pg (ref 26.0–34.0)
MCH: 30.3 pg (ref 26.0–34.0)
MCH: 30.8 pg (ref 26.0–34.0)
MCHC: 34.2 g/dL (ref 30.0–36.0)
MCHC: 34 g/dL (ref 30.0–36.0)
MCHC: 34.4 g/dL (ref 30.0–36.0)
MCV: 88.3 fL (ref 78.0–100.0)
MCV: 89.1 fL (ref 78.0–100.0)
MCV: 89.4 fL (ref 78.0–100.0)
PLATELETS: 134 10*3/uL — AB (ref 150–400)
PLATELETS: 140 10*3/uL — AB (ref 150–400)
Platelets: 132 10*3/uL — ABNORMAL LOW (ref 150–400)
RBC: 3.77 MIL/uL — ABNORMAL LOW (ref 3.87–5.11)
RBC: 3.66 MIL/uL — AB (ref 3.87–5.11)
RBC: 3.41 MIL/uL — AB (ref 3.87–5.11)
RDW: 14.4 % (ref 11.5–15.5)
RDW: 14.5 % (ref 11.5–15.5)
RDW: 14.7 % (ref 11.5–15.5)
WBC: 8.8 10*3/uL (ref 4.0–10.5)
WBC: 7.4 10*3/uL (ref 4.0–10.5)
WBC: 6.2 10*3/uL (ref 4.0–10.5)

## 2017-03-11 MED ORDER — LABETALOL HCL 5 MG/ML IV SOLN
20.0000 mg | Freq: Once | INTRAVENOUS | Status: AC
  Administered 2017-03-11: 20 mg via INTRAVENOUS
  Filled 2017-03-11: qty 4

## 2017-03-11 MED ORDER — LABETALOL HCL 5 MG/ML IV SOLN: 20.0000 mg | Freq: Once | INTRAVENOUS | Status: DC

## 2017-03-11 MED ORDER — MAGNESIUM SULFATE BOLUS VIA INFUSION
4.0000 g | Freq: Once | INTRAVENOUS | Status: AC
  Administered 2017-03-11: 4 g via INTRAVENOUS
  Filled 2017-03-11: qty 500

## 2017-03-11 MED ORDER — LABETALOL HCL 5 MG/ML IV SOLN: 20.0000 mg | INTRAVENOUS | Status: DC | PRN

## 2017-03-11 MED ORDER — LABETALOL HCL 200 MG PO TABS
600.0000 mg | ORAL_TABLET | Freq: Three times a day (TID) | ORAL | Status: DC
  Administered 2017-03-11 – 2017-03-14 (×11): 600 mg via ORAL
  Filled 2017-03-11 (×2): qty 3
  Filled 2017-03-11: qty 6
  Filled 2017-03-11 (×9): qty 3

## 2017-03-11 MED ORDER — MAGNESIUM SULFATE 40 G IN LACTATED RINGERS - SIMPLE
2.0000 g/h | INTRAVENOUS | Status: AC
  Administered 2017-03-11 – 2017-03-12 (×2): 2 g/h via INTRAVENOUS
  Filled 2017-03-11 (×2): qty 40

## 2017-03-11 MED ORDER — HYDRALAZINE HCL 20 MG/ML IJ SOLN: 10.0000 mg | Freq: Once | INTRAMUSCULAR | Status: DC | PRN

## 2017-03-11 MED ORDER — LABETALOL HCL 200 MG PO TABS: 200.0000 mg | ORAL_TABLET | Freq: Two times a day (BID) | ORAL | Status: DC

## 2017-03-11 MED ORDER — LABETALOL HCL 200 MG PO TABS
200.0000 mg | ORAL_TABLET | Freq: Once | ORAL | Status: AC
  Administered 2017-03-11: 200 mg via ORAL
  Filled 2017-03-11: qty 1

## 2017-03-11 MED ORDER — LABETALOL HCL 200 MG PO TABS
400.0000 mg | ORAL_TABLET | Freq: Three times a day (TID) | ORAL | Status: DC
  Administered 2017-03-11: 400 mg via ORAL
  Filled 2017-03-11: qty 2

## 2017-03-11 NOTE — Progress Notes (Addendum)
Notified Dr. Alysia PennaErvin regarding new orders by Dr. Emelda FearFerguson for Magnesium Sulfate and Labetalol IVP.  Dr. Alysia PennaErvin stated to hold off administering meds for now.

## 2017-03-11 NOTE — Progress Notes (Signed)
Post IV placement, pt. Upset she had to get a new IV. Will recheck

## 2017-03-11 NOTE — Consult Note (Signed)
Maternal Fetal Medicine Consultation  Requesting Provider(s): Christin Bach, MD  Reason for consultation: Preeclampsia with severe features  HPI: Misty Mckenzie is a 26 yo G1P0, EDD 05/13/2017 seen for consultation due to preeclampsia with severe features.  Ms. Bole reports that she took her blood pressure at home on the 15th of July - was 152/100 at that time.  Ms. Goracke was evaluated and admitted - completed a course of betamethasone and was on Magnesium sulfate for the initial 24 hrs of her admission.  She was relatively stable over the last several days, but blood pressures became acutely worse over the last 24 hrs.  She is currently on po Labetalol 400 mg TID.  She denies HA, visual changes or RUQ pain.  Her prenatal course has been uncomplicated.  She denies any history of hypertension prior to pregnancy.  Ultrasound performed on admission showed an estimated fetal weight at the 69th %tile with normal amniotic fluid volume.  OB History: OB History    Gravida Para Term Preterm AB Living   2       1     SAB TAB Ectopic Multiple Live Births       1          PMH:  Past Medical History:  Diagnosis Date  . Medical history non-contributory     PSH:  Past Surgical History:  Procedure Laterality Date  . NO PAST SURGERIES     Meds:  Scheduled Meds: . docusate sodium  100 mg Oral Daily  . labetalol  600 mg Oral TID  . labetalol  20 mg Intravenous Once  . magnesium  4 g Intravenous Once  . prenatal multivitamin  1 tablet Oral Q1200   Continuous Infusions: . lactated ringers Stopped (03/08/17 0630)  . magnesium sulfate     PRN Meds:.acetaminophen, calcium carbonate, hydrALAZINE, hydrALAZINE, labetalol, zolpidem   Allergies: No Known Allergies   FH:  Family History  Problem Relation Age of Onset  . Hypertension Father   . Cancer Maternal Grandmother   . Diabetes Maternal Grandmother    Soc:  Social History   Social History  . Marital status: Single    Spouse  name: N/A  . Number of children: N/A  . Years of education: N/A   Occupational History  . Not on file.   Social History Main Topics  . Smoking status: Never Smoker  . Smokeless tobacco: Never Used  . Alcohol use No  . Drug use: No  . Sexual activity: Not on file   Other Topics Concern  . Not on file   Social History Narrative  . No narrative on file    Review of Systems: no vaginal bleeding or cramping/contractions, no LOF, no nausea/vomiting. All other systems reviewed and are negative.   PE:   Vitals:   03/11/17 0846 03/11/17 0954  BP: (!) 162/101 (!) 172/100  Pulse:  82  Resp:    Temp:      GEN: well-appearing female ABD: gravid, NT DTRs: 2+ bilaterally; one beat of clonus.  2+ pitting edema  Labs: CBC    Component Value Date/Time   WBC 8.8 03/11/2017 0950   RBC 3.77 (L) 03/11/2017 0950   HGB 11.4 (L) 03/11/2017 0950   HGB 12.4 02/25/2017 1135   HCT 33.3 (L) 03/11/2017 0950   HCT 37.6 02/25/2017 1135   PLT 134 (L) 03/11/2017 0950   PLT 229 02/25/2017 1135   MCV 88.3 03/11/2017 0950   MCV 91 02/25/2017 1135  MCH 30.2 03/11/2017 0950   MCHC 34.2 03/11/2017 0950   RDW 14.4 03/11/2017 0950   RDW 14.7 02/25/2017 1135   LYMPHSABS 1.5 03/09/2017 0501   LYMPHSABS 1.5 11/02/2016 1436   MONOABS 0.6 03/09/2017 0501   EOSABS 0.0 03/09/2017 0501   EOSABS 0.0 11/02/2016 1436   BASOSABS 0.0 03/09/2017 0501   BASOSABS 0.0 11/02/2016 1436   CMP     Component Value Date/Time   NA 135 03/11/2017 0950   K 4.4 03/11/2017 0950   CL 108 03/11/2017 0950   CO2 22 03/11/2017 0950   GLUCOSE 66 03/11/2017 0950   BUN 13 03/11/2017 0950   CREATININE 0.59 03/11/2017 0950   CALCIUM 7.6 (L) 03/11/2017 0950   PROT 5.0 (L) 03/11/2017 0950   ALBUMIN 2.1 (L) 03/11/2017 0950   AST 56 (H) 03/11/2017 0950   ALT 37 03/11/2017 0950   ALKPHOS 246 (H) 03/11/2017 0950   BILITOT 0.5 03/11/2017 0950   GFRNONAA >60 03/11/2017 0950   GFRAA >60 03/11/2017 0950   24 hr urine  protein: 3608 g  A/P: 1) Single IUP at 31w 0d  2) Preeclampsia with severe features - the patient previously completed a course of betamethasone on 15/16 July.  She was given prophylactic Magnesium sulfate for the first 24 hrs of her admission.  Preeclampsia labs have been stable / normal with the exception of her 24 hr urine protein and now mildly elevated AST of 56.  Given the acute change in blood pressures and now worsening AST, feel that it is extremely unlikely that she will be able to be managed expectantly until 34 weeks.  Recommendations: 1) Would give IV Labetalol now (per protocol) 2) Increase Labetalol to 600 mg TID 3) Would restart Magnesium sulfate prophylaxis now given the acute clinical change 4) Continuous fetal monitoring for now given the acute clinical change 5) If unable to stabilize blood pressures over the next several hours, would move toward delivery 6) Given worsening AST, recommend repeating LFTs and CBC in 6 hours - if LFTs > 2x normal, this would also be an indication to move toward delivery.  Would recommend checking labs every 6 hours over the next 24 hrs; if stable may decrease the frequency to daily for now 7) NICU consult - feel that the likelihood of requiring delivery over the next 24-48 hrs is very high  Recommendations discussed with Dr. Emelda FearFerguson  Thank you for the opportunity to be a part of the care of Misty Mckenzie. Please contact our office if we can be of further assistance.   I spent approximately 30 minutes with this patient with over 50% of time spent in face-to-face counseling.  Alpha GulaPaul Gladyce Mcray, MD Maternal Fetal Medicine

## 2017-03-11 NOTE — Consult Note (Signed)
[facility]  Prenatal Consult       03/11/2017  2:30 PM   I was asked by Dr. Esperanza SheetsWhitacar to consult on this patient for possible preterm delivery.  I had the pleasure of meeting with Ms. Misty Mckenzie today.  She is a 26 y/o G2P0010 at 3631 and 0/[redacted] weeks gestation.  She was admitted on 7/15 for preeclampsia that is now worsening and it is likely she will need to deliver in the next 24-48 hours.  The fetus is a female to be named Aasha.    I explained that the neonatal intensive care team would be present for the delivery and outlined the likely delivery room course for this baby including routine resuscitation and NRP-guided approaches to the treatment of respiratory distress. We discussed other common problems associated with prematurity including respiratory distress syndrome/CLD, apnea, feeding issues, temperature regulation, and infection risk.  We briefly discussed IVH/PVL, ROP, and NEC and that these are complications associated with prematurity, but that by 30 weeks are uncommon.    We discussed the average length of stay but I noted that the actual LOS would depend on the severity of problems encountered and response to treatments.  We discussed visitation policies and the resources available while her child is in the hospital.  We discussed the importance of good nutrition and various methods of providing nutrition (parenteral hyperalimentation, gavage feedings and/or oral feeding). We discussed the benefits of human milk. I encouraged breast feeding and pumping soon after birth and outlined resources that are available to support breast feeding. She does intend on breastfeeding her infant.   Thank you for involving us in the care of this patient. A member of our team will be available should the family have additional questions.  Time for consultation approximately 45 minutes.   _____________________ Electronically Signed By: Maryan CharLindsey Ragen Laver, MD Neonatologist

## 2017-03-11 NOTE — Progress Notes (Signed)
[name], female   DOB: 11/13/1990, 26 y.o.   MRN: [id number] OB Attending  Pt reports feeling drowsy with the magnesium. Denies HA, visual changes or RUQ pain. Reports good fetal movement.  PE BP 130/80's Lungs clear Heart RRR Abd soft + BS gravid Ext 1 + edema, DTR's + 2, no clonus  Labs Stable no change in LFT's  A/P Stable Continue with q 6 hr labs, magnesium x 24 and labetalol. Will proceed to delivery for any worsen preeclampsia or fetal indication.

## 2017-03-11 NOTE — Progress Notes (Signed)
Called MD, RN answered phone, message left with Marvell FullerShannon Earl, RN for MD to call d/t pt's ^BP.

## 2017-03-11 NOTE — Progress Notes (Signed)
Message left w/ Philipp DeputyKim Shaw, CNM to give Dr. Emelda FearFerguson regarding pt's ^BP's.

## 2017-03-11 NOTE — Progress Notes (Addendum)
FACULTY PRACTICE ANTEPARTUM(COMPREHENSIVE) NOTE  Misty Mckenzie is a 26 y.o. G2P0010 at 9737w0d  who is admitted for preeclampsia remote from term with severe features of elevated BP's to 170/110. She is now HD 4, and has received BMz x2,  and Mag sulfate x 24,  And since d/c of Mag sulfate, the pt is having frequent pressures elevated to severe range.   Fetal presentation is cephalic. Growth at 69%ile as ofn  7/16 Length of Stay:  4  Days  Subjective:  Patient reports the fetal movement as active. Patient reports uterine contraction  activity as none. Patient reports  vaginal bleeding as none. Patient describes fluid per vagina as None.  Vitals:  Blood pressure (!) 162/101, pulse 76, temperature 98.5 F (36.9 C), temperature source Oral, resp. rate 18, height 5\' 5"  (1.651 m), weight 133 lb (60.3 kg), last menstrual period 08/06/2016, SpO2 97 %, unknown if currently breastfeeding.  Temp:  [97.5 F (36.4 C)-98.7 F (37.1 C)] 98.5 F (36.9 C) (07/19 0738) Pulse Rate:  [73-101] 76 (07/19 0809) Resp:  [16-18] 18 (07/19 0738) BP: (133-168)/(89-109) 162/101 (07/19 0846) SpO2:  [96 %-100 %] 97 % (07/19 0738)  Physical Examination:  General appearance - alert, well appearing, and in no distress and mildly edematous Heart - normal rate and regular rhythm Abdomen - soft, nontender, nondistended Fundal Height:  size equals dates Cervical Exam: Not evaluated. andd fetal presentation is cephalic. Extremities: extremities normal, atraumatic, no cyanosis or edema and Homans sign is negative, no sign of DVT with DTRs 3+ bilaterally no clonus Membranes:intact  Fetal Monitoring:  Baseline: 135 bpm, Variability: Fair (1-6 bpm), Accelerations: Non-reactive but appropriate for gestational age and Decelerations: Absent  Labs:  No results found for this or any previous visit (from the past 24 hour(s)).  Imaging Studies:     Currently EPIC will not allow sonographic studies to automatically populate  into notes.  In the meantime, copy and paste results into note or free text.  Medications:  Scheduled . docusate sodium  100 mg Oral Daily  . labetalol  400 mg Oral TID  . prenatal multivitamin  1 tablet Oral Q1200   I have reviewed the patient's current medications. Pt is s/p Mag sulfate and Bmz.  ASSESSMENT: Patient Active Problem List   Diagnosis Date Noted  . Preeclampsia, severe, third trimester 03/07/2017  . GBS bacteriuria 11/04/2016  . Supervision of normal pregnancy, antepartum 11/02/2016    PLAN: Case discussed with Dr Harlon FlorWhitaker who is MFM consultant for the day. He will consult formally this morning, Current plan is to add another dose IV labetalol, and max out PO labetalol at 600 tid, and if pressures remain elevated on max dosing of Labetalol would consider delivery.  Maximino Cozzolino V 03/11/2017,9:45 AM ADDENDUM : CASE DISCUSSED FURTHER WITH dR wHITAKER, WHO ADVISES RESTARTING MAG SULFATE , GIVEN HER SIGNIFICANTLY ELEVATED PRESSURES, AND IF UNABLE TO GET PRESSURES UNDER ADEQUATE CONTROL TODAY ON ONE AGENT, WOULD MOVE TOWARD INDUCTION.   [name], female   DOB: 04/13/1991, 26 y.o.   MRN: [id number]

## 2017-03-12 LAB — CBC
HCT: 29.5 % — ABNORMAL LOW (ref 36.0–46.0)
Hemoglobin: 10.2 g/dL — ABNORMAL LOW (ref 12.0–15.0)
MCH: 30.4 pg (ref 26.0–34.0)
MCHC: 34.6 g/dL (ref 30.0–36.0)
MCV: 88.1 fL (ref 78.0–100.0)
Platelets: 138 K/uL — ABNORMAL LOW (ref 150–400)
RBC: 3.35 MIL/uL — ABNORMAL LOW (ref 3.87–5.11)
RDW: 14.6 % (ref 11.5–15.5)
WBC: 6.2 K/uL (ref 4.0–10.5)

## 2017-03-12 LAB — COMPREHENSIVE METABOLIC PANEL WITH GFR
ALT: 30 U/L (ref 14–54)
AST: 42 U/L — ABNORMAL HIGH (ref 15–41)
Albumin: 1.9 g/dL — ABNORMAL LOW (ref 3.5–5.0)
Alkaline Phosphatase: 237 U/L — ABNORMAL HIGH (ref 38–126)
Anion gap: 5 (ref 5–15)
BUN: 13 mg/dL (ref 6–20)
CO2: 21 mmol/L — ABNORMAL LOW (ref 22–32)
Calcium: 7.1 mg/dL — ABNORMAL LOW (ref 8.9–10.3)
Chloride: 107 mmol/L (ref 101–111)
Creatinine, Ser: 0.62 mg/dL (ref 0.44–1.00)
GFR calc Af Amer: >60 mL/min
GFR calc non Af Amer: >60 mL/min
Glucose, Bld: 87 mg/dL (ref 65–99)
Potassium: 4.2 mmol/L (ref 3.5–5.1)
Sodium: 133 mmol/L — ABNORMAL LOW (ref 135–145)
Total Bilirubin: 0.3 mg/dL (ref 0.3–1.2)
Total Protein: 4.8 g/dL — ABNORMAL LOW (ref 6.5–8.1)

## 2017-03-12 LAB — MAGNESIUM: Magnesium: 6.3 mg/dL (ref 1.7–2.4)

## 2017-03-12 NOTE — Progress Notes (Signed)
[name], female   DOB: 1991/08/18, 26 y.o.   MRN: [id number] ACULTY PRACTICE ANTEPARTUM COMPREHENSIVE PROGRESS NOTE  Misty Mckenzie is a 26 y.o. G2P0010 at [redacted]w[redacted]d  who is admitted for PEC.   Fetal presentation is cephalic. Length of Stay:  5  Days  Subjective: Pt without complaints. Denies HA or visual changes. + FM    Vitals:  Blood pressure (!) 102/55, pulse 65, temperature 97.8 F (36.6 C), temperature source Oral, resp. rate 16, height 5\' 5"  (1.651 m), weight 60.3 kg (133 lb), last menstrual period 08/06/2016, SpO2 100 %, unknown if currently breastfeeding. Physical Examination: Lungs clear Heart RRR Abd soft + BS gravid Ext trace edema, nl DTR's  Fetal Monitoring:  Baseline: 120's bpm  Labs:  Results for orders placed or performed during the hospital encounter of 03/07/17 (from the past 24 hour(s))  CBC   Collection Time: 03/11/17  9:50 AM  Result Value Ref Range   WBC 8.8 4.0 - 10.5 K/uL   RBC 3.77 (L) 3.87 - 5.11 MIL/uL   Hemoglobin 11.4 (L) 12.0 - 15.0 g/dL   HCT 40.9 (L) 81.1 - 91.4 %   MCV 88.3 78.0 - 100.0 fL   MCH 30.2 26.0 - 34.0 pg   MCHC 34.2 30.0 - 36.0 g/dL   RDW 78.2 95.6 - 21.3 %   Platelets 134 (L) 150 - 400 K/uL  Comprehensive metabolic panel   Collection Time: 03/11/17  9:50 AM  Result Value Ref Range   Sodium 135 135 - 145 mmol/L   Potassium 4.4 3.5 - 5.1 mmol/L   Chloride 108 101 - 111 mmol/L   CO2 22 22 - 32 mmol/L   Glucose, Bld 66 65 - 99 mg/dL   BUN 13 6 - 20 mg/dL   Creatinine, Ser 0.86 0.44 - 1.00 mg/dL   Calcium 7.6 (L) 8.9 - 10.3 mg/dL   Total Protein 5.0 (L) 6.5 - 8.1 g/dL   Albumin 2.1 (L) 3.5 - 5.0 g/dL   AST 56 (H) 15 - 41 U/L   ALT 37 14 - 54 U/L   Alkaline Phosphatase 246 (H) 38 - 126 U/L   Total Bilirubin 0.5 0.3 - 1.2 mg/dL   GFR calc non Af Amer >60 >60 mL/min   GFR calc Af Amer >60 >60 mL/min   Anion gap 5 5 - 15  CBC   Collection Time: 03/11/17  4:14 PM  Result Value Ref Range   WBC 7.4 4.0 - 10.5 K/uL    RBC 3.66 (L) 3.87 - 5.11 MIL/uL   Hemoglobin 11.1 (L) 12.0 - 15.0 g/dL   HCT 57.8 (L) 46.9 - 62.9 %   MCV 89.1 78.0 - 100.0 fL   MCH 30.3 26.0 - 34.0 pg   MCHC 34.0 30.0 - 36.0 g/dL   RDW 52.8 41.3 - 24.4 %   Platelets 140 (L) 150 - 400 K/uL  Comprehensive metabolic panel   Collection Time: 03/11/17  4:14 PM  Result Value Ref Range   Sodium 134 (L) 135 - 145 mmol/L   Potassium 4.3 3.5 - 5.1 mmol/L   Chloride 107 101 - 111 mmol/L   CO2 20 (L) 22 - 32 mmol/L   Glucose, Bld 109 (H) 65 - 99 mg/dL   BUN 14 6 - 20 mg/dL   Creatinine, Ser 0.10 0.44 - 1.00 mg/dL   Calcium 7.5 (L) 8.9 - 10.3 mg/dL   Total Protein 5.1 (L) 6.5 - 8.1 g/dL   Albumin 2.1 (L) 3.5 -  5.0 g/dL   AST 56 (H) 15 - 41 U/L   ALT 36 14 - 54 U/L   Alkaline Phosphatase 255 (H) 38 - 126 U/L   Total Bilirubin 0.5 0.3 - 1.2 mg/dL   GFR calc non Af Amer >60 >60 mL/min   GFR calc Af Amer >60 >60 mL/min   Anion gap 7 5 - 15  CBC   Collection Time: 03/11/17  9:56 PM  Result Value Ref Range   WBC 6.2 4.0 - 10.5 K/uL   RBC 3.41 (L) 3.87 - 5.11 MIL/uL   Hemoglobin 10.5 (L) 12.0 - 15.0 g/dL   HCT 16.130.5 (L) 09.636.0 - 04.546.0 %   MCV 89.4 78.0 - 100.0 fL   MCH 30.8 26.0 - 34.0 pg   MCHC 34.4 30.0 - 36.0 g/dL   RDW 40.914.7 81.111.5 - 91.415.5 %   Platelets 132 (L) 150 - 400 K/uL  Comprehensive metabolic panel   Collection Time: 03/11/17  9:56 PM  Result Value Ref Range   Sodium 135 135 - 145 mmol/L   Potassium 4.3 3.5 - 5.1 mmol/L   Chloride 108 101 - 111 mmol/L   CO2 20 (L) 22 - 32 mmol/L   Glucose, Bld 102 (H) 65 - 99 mg/dL   BUN 14 6 - 20 mg/dL   Creatinine, Ser 7.820.65 0.44 - 1.00 mg/dL   Calcium 7.5 (L) 8.9 - 10.3 mg/dL   Total Protein 4.9 (L) 6.5 - 8.1 g/dL   Albumin 2.0 (L) 3.5 - 5.0 g/dL   AST 50 (H) 15 - 41 U/L   ALT 32 14 - 54 U/L   Alkaline Phosphatase 244 (H) 38 - 126 U/L   Total Bilirubin 0.5 0.3 - 1.2 mg/dL   GFR calc non Af Amer >60 >60 mL/min   GFR calc Af Amer >60 >60 mL/min   Anion gap 7 5 - 15  CBC   Collection  Time: 03/12/17  3:44 AM  Result Value Ref Range   WBC 6.2 4.0 - 10.5 K/uL   RBC 3.35 (L) 3.87 - 5.11 MIL/uL   Hemoglobin 10.2 (L) 12.0 - 15.0 g/dL   HCT 95.629.5 (L) 21.336.0 - 08.646.0 %   MCV 88.1 78.0 - 100.0 fL   MCH 30.4 26.0 - 34.0 pg   MCHC 34.6 30.0 - 36.0 g/dL   RDW 57.814.6 46.911.5 - 62.915.5 %   Platelets 138 (L) 150 - 400 K/uL  Comprehensive metabolic panel   Collection Time: 03/12/17  3:44 AM  Result Value Ref Range   Sodium 133 (L) 135 - 145 mmol/L   Potassium 4.2 3.5 - 5.1 mmol/L   Chloride 107 101 - 111 mmol/L   CO2 21 (L) 22 - 32 mmol/L   Glucose, Bld 87 65 - 99 mg/dL   BUN 13 6 - 20 mg/dL   Creatinine, Ser 5.280.62 0.44 - 1.00 mg/dL   Calcium 7.1 (L) 8.9 - 10.3 mg/dL   Total Protein 4.8 (L) 6.5 - 8.1 g/dL   Albumin 1.9 (L) 3.5 - 5.0 g/dL   AST 42 (H) 15 - 41 U/L   ALT 30 14 - 54 U/L   Alkaline Phosphatase 237 (H) 38 - 126 U/L   Total Bilirubin 0.3 0.3 - 1.2 mg/dL   GFR calc non Af Amer >60 >60 mL/min   GFR calc Af Amer >60 >60 mL/min   Anion gap 5 5 - 15    Imaging Studies:    none   Medications:  Scheduled . docusate  sodium  100 mg Oral Daily  . labetalol  600 mg Oral TID  . labetalol  20 mg Intravenous Once  . prenatal multivitamin  1 tablet Oral Q1200   I have reviewed the patient's current medications.  ASSESSMENT: Patient Active Problem List   Diagnosis Date Noted  . Preeclampsia, severe, third trimester 03/07/2017  . GBS bacteriuria 11/04/2016  . Supervision of normal pregnancy, antepartum 11/02/2016    PLAN: Stable. Will complete magnesium today @ 12 noon. Serial labs without evidence of HELLP. BP stable. Will monitor off magnesium and deliver for maternal or fetal indications. Continue routine antenatal care.   Hermina Staggers 03/12/2017,7:12 AM

## 2017-03-12 NOTE — Progress Notes (Signed)
CRITICAL VALUE ALERT  Critical Value:  Magnesium Level 6.3  Date & Time Notied:  03/12/17 @ 0719  Provider Notified: Dr. Alysia PennaErvin  Orders Received/Actions taken: None

## 2017-03-13 ENCOUNTER — Encounter (HOSPITAL_COMMUNITY): Payer: Self-pay | Admitting: Obstetrics and Gynecology

## 2017-03-13 DIAGNOSIS — O1413 Severe pre-eclampsia, third trimester: Secondary | ICD-10-CM

## 2017-03-13 LAB — TYPE AND SCREEN
ABO/RH(D): B POS
ANTIBODY SCREEN: NEGATIVE

## 2017-03-13 LAB — COMPREHENSIVE METABOLIC PANEL
ALT: 26 U/L (ref 14–54)
AST: 37 U/L (ref 15–41)
Albumin: 1.9 g/dL — ABNORMAL LOW (ref 3.5–5.0)
Alkaline Phosphatase: 271 U/L — ABNORMAL HIGH (ref 38–126)
Anion gap: 5 (ref 5–15)
BUN: 14 mg/dL (ref 6–20)
CHLORIDE: 108 mmol/L (ref 101–111)
CO2: 22 mmol/L (ref 22–32)
Calcium: 7.3 mg/dL — ABNORMAL LOW (ref 8.9–10.3)
Creatinine, Ser: 0.75 mg/dL (ref 0.44–1.00)
Glucose, Bld: 70 mg/dL (ref 65–99)
POTASSIUM: 4.2 mmol/L (ref 3.5–5.1)
SODIUM: 135 mmol/L (ref 135–145)
Total Bilirubin: 0.6 mg/dL (ref 0.3–1.2)
Total Protein: 5 g/dL — ABNORMAL LOW (ref 6.5–8.1)

## 2017-03-13 LAB — CBC
HCT: 31.7 % — ABNORMAL LOW (ref 36.0–46.0)
Hemoglobin: 10.7 g/dL — ABNORMAL LOW (ref 12.0–15.0)
MCH: 30 pg (ref 26.0–34.0)
MCHC: 33.8 g/dL (ref 30.0–36.0)
MCV: 88.8 fL (ref 78.0–100.0)
PLATELETS: 149 10*3/uL — AB (ref 150–400)
RBC: 3.57 MIL/uL — AB (ref 3.87–5.11)
RDW: 14.6 % (ref 11.5–15.5)
WBC: 5.1 10*3/uL (ref 4.0–10.5)

## 2017-03-13 NOTE — Progress Notes (Signed)
[name], female   DOB: 12/06/1990, 26 y.o.   MRN: [id number] [facility]ULT[facility] PRACTICE ANTEPARTUM COMPREHENSIVE PROGRESS NOTE  Misty Loftyleah Corprew is a 26 y.o. G2P0010 at 6213w2d  who is admitted for severe preeclampsia. Fetal presentation is cephalic. Length of Stay:  6  Days  Subjective: She is  without complaints. Denies HA or visual changes. + FM    Vitals:  Blood pressure (!) 142/82, pulse 81, temperature 98.9 F (37.2 C), temperature source Oral, resp. rate 17, height 5\' 5"  (1.651 m), weight 133 lb (60.3 kg), last menstrual period 08/06/2016, SpO2 98 %, unknown if currently breastfeeding. Physical Examination: Lungs clear Heart RRR Abd soft + BS gravid Ext trace edema, nl DTR's  Fetal Monitoring:  Baseline: 120's bpm, reassuring  Labs:  Results for orders placed or performed during the hospital encounter of 03/07/17 (from the past 24 hour(s))  CBC   Collection Time: 03/13/17  6:03 AM  Result Value Ref Range   WBC 5.1 4.0 - 10.5 K/uL   RBC 3.57 (L) 3.87 - 5.11 MIL/uL   Hemoglobin 10.7 (L) 12.0 - 15.0 g/dL   HCT 40.931.7 (L) 81.136.0 - 91.446.0 %   MCV 88.8 78.0 - 100.0 fL   MCH 30.0 26.0 - 34.0 pg   MCHC 33.8 30.0 - 36.0 g/dL   RDW 78.214.6 95.611.5 - 21.315.5 %   Platelets 149 (L) 150 - 400 K/uL  Comprehensive metabolic panel   Collection Time: 03/13/17  6:03 AM  Result Value Ref Range   Sodium 135 135 - 145 mmol/L   Potassium 4.2 3.5 - 5.1 mmol/L   Chloride 108 101 - 111 mmol/L   CO2 22 22 - 32 mmol/L   Glucose, Bld 70 65 - 99 mg/dL   BUN 14 6 - 20 mg/dL   Creatinine, Ser 0.860.75 0.44 - 1.00 mg/dL   Calcium 7.3 (L) 8.9 - 10.3 mg/dL   Total Protein 5.0 (L) 6.5 - 8.1 g/dL   Albumin 1.9 (L) 3.5 - 5.0 g/dL   AST 37 15 - 41 U/L   ALT 26 14 - 54 U/L   Alkaline Phosphatase 271 (H) 38 - 126 U/L   Total Bilirubin 0.6 0.3 - 1.2 mg/dL   GFR calc non Af Amer >60 >60 mL/min   GFR calc Af Amer >60 >60 mL/min   Anion gap 5 5 - 15  Type and [facility]   Collection Time:  03/13/17  6:03 AM  Result Value Ref Range   ABO/RH(D) B POS    Antibody Screen NEG    Sample Expiration 03/16/2017     Imaging Studies:    none   Medications:  Scheduled . docusate sodium  100 mg Oral Daily  . labetalol  600 mg Oral TID  . labetalol  20 mg Intravenous Once  . prenatal multivitamin  1 tablet Oral Q1200   I have reviewed the patient's current medications.  ASSESSMENT: Patient Active Problem List   Diagnosis Date Noted  . Preeclampsia, severe, third trimester 03/07/2017  . GBS bacteriuria 11/04/2016  . Supervision of normal pregnancy, antepartum 11/02/2016    PLAN: Stable s/p magnesium today. Serial labs without evidence of HELLP; recheck ordered on 7/23. BP stable on Labetalol. Will deliver for maternal or fetal indications. Reassuring FWB. Continue routine antenatal care.   Jaynie CollinsUgonna Cloe Sockwell, MD 03/13/2017,8:24 AM

## 2017-03-14 LAB — COMPREHENSIVE METABOLIC PANEL
ALT: 38 U/L (ref 14–54)
AST: 57 U/L — ABNORMAL HIGH (ref 15–41)
Albumin: 1.9 g/dL — ABNORMAL LOW (ref 3.5–5.0)
Alkaline Phosphatase: 279 U/L — ABNORMAL HIGH (ref 38–126)
Anion gap: 5 (ref 5–15)
BUN: 14 mg/dL (ref 6–20)
CHLORIDE: 110 mmol/L (ref 101–111)
CO2: 21 mmol/L — AB (ref 22–32)
CREATININE: 0.85 mg/dL (ref 0.44–1.00)
Calcium: 7.5 mg/dL — ABNORMAL LOW (ref 8.9–10.3)
GFR calc non Af Amer: >60 mL/min (ref >60)
Glucose, Bld: 99 mg/dL (ref 65–99)
POTASSIUM: 4.1 mmol/L (ref 3.5–5.1)
SODIUM: 136 mmol/L (ref 135–145)
Total Bilirubin: 0.5 mg/dL (ref 0.3–1.2)
Total Protein: 5 g/dL — ABNORMAL LOW (ref 6.5–8.1)

## 2017-03-14 LAB — CBC
HCT: 32.2 % — ABNORMAL LOW (ref 36.0–46.0)
Hemoglobin: 10.8 g/dL — ABNORMAL LOW (ref 12.0–15.0)
MCH: 30 pg (ref 26.0–34.0)
MCHC: 33.5 g/dL (ref 30.0–36.0)
MCV: 89.4 fL (ref 78.0–100.0)
PLATELETS: 148 10*3/uL — AB (ref 150–400)
RBC: 3.6 MIL/uL — AB (ref 3.87–5.11)
RDW: 14.5 % (ref 11.5–15.5)
WBC: 5.6 10*3/uL (ref 4.0–10.5)

## 2017-03-14 MED ORDER — FLUTICASONE PROPIONATE 50 MCG/ACT NA SUSP
1.0000 | Freq: Every day | NASAL | Status: DC | PRN
  Administered 2017-03-14 – 2017-03-16 (×3): 1 via NASAL
  Filled 2017-03-14: qty 16

## 2017-03-14 NOTE — Progress Notes (Addendum)
Daily Antepartum Note  Admission Date: 03/07/2017 Current Date: 03/14/2017 6:58 AM  Misty Mckenzie is a 26 y.o. G2P0010 @ 7025w3d, HD#8, admitted for partial HELLP  Pregnancy complicated by: Patient Active Problem List   Diagnosis Date Noted  . Preeclampsia, severe, third trimester 03/07/2017  . GBS bacteriuria 11/04/2016  . Supervision of high-risk pregnancy 11/02/2016    Overnight/24hr events:  none  Subjective:  No s/s of pre-x, PTL, decreased FM  Objective:    Current Vital Signs 24h Vital Sign Ranges  T 98.4 F (36.9 C) Temp  Avg: 98.1 F (36.7 C)  Min: 97.7 F (36.5 C)  Max: 98.4 F (36.9 C)  BP (!) 141/90 BP  Min: 134/87  Max: 146/87  HR 84 Pulse  Avg: 80.6  Min: 73  Max: 88  RR 18 Resp  Avg: 17.7  Min: 16  Max: 18  SaO2 96 % Not Delivered SpO2  Avg: 98 %  Min: 96 %  Max: 100 %       24 Hour I/O Current Shift I/O  Time Ins Outs 07/21 0701 - 07/22 0700 In: 360 [P.O.:360] Out: -  No intake/output data recorded.   Patient Vitals for the past 24 hrs:  BP Temp Temp src Pulse Resp SpO2  03/14/17 0430 (!) 141/90 98.4 F (36.9 C) Oral 84 18 96 %  03/13/17 2349 (!) 146/87 98 F (36.7 C) Oral 88 18 97 %  03/13/17 2211 - - - - 16 -  03/13/17 2127 - - - - 18 -  03/13/17 2000 (!) 144/94 97.7 F (36.5 C) Oral 73 18 100 %  03/13/17 1955 - - - - 18 -  03/13/17 1710 - - - - - 100 %  03/13/17 1709 134/87 97.9 F (36.6 C) Oral 76 17 -  03/13/17 1226 (!) 146/90 98.2 F (36.8 C) Oral 79 18 96 %  03/13/17 1117 (!) 137/93 - - 77 - -  03/13/17 0800 (!) 144/93 98.1 F (36.7 C) Oral 87 18 99 %   130 baseline, no accel, no decel, mod variability, toco quiet 130 baseline, ?accel, no decel, mod variability, toco quiet  Physical exam: General: Well nourished, well developed female in no acute distress. Abdomen: gravid, nttp Cardiovascular: S1, S2 normal, no murmur, rub or gallop, regular rate and rhythm Respiratory: CTAB Extremities:1 to 2+ b/l LE edema Skin: Warm and dry.    Medications: Current Facility-Administered Medications  Medication Dose Route Frequency Provider Last Rate Last Dose  . acetaminophen (TYLENOL) tablet 650 mg  650 mg Oral Q4H PRN Donette LarryBhambri, Melanie, CNM   650 mg at 03/13/17 1231  . calcium carbonate (TUMS - dosed in mg elemental calcium) chewable tablet 400 mg of elemental calcium  2 tablet Oral Q4H PRN Donette LarryBhambri, Melanie, CNM   400 mg of elemental calcium at 03/13/17 1231  . docusate sodium (COLACE) capsule 100 mg  100 mg Oral Daily Donette LarryBhambri, Melanie, CNM   100 mg at 03/13/17 [id number]  . hydrALAZINE (APRESOLINE) injection 10 mg  10 mg Intravenous Once PRN Tilda BurrowFerguson, John V, MD      . labetalol (NORMODYNE) tablet 600 mg  600 mg Oral TID Tilda BurrowFerguson, John V, MD   600 mg at 03/13/17 2211  . labetalol (NORMODYNE,TRANDATE) injection 20 mg  20 mg Intravenous Once Tilda BurrowFerguson, John V, MD      . labetalol (NORMODYNE,TRANDATE) injection 20-80 mg  20-80 mg Intravenous Q10 min PRN Tilda BurrowFerguson, John V, MD      . prenatal multivitamin tablet 1  tablet  1 tablet Oral Q1200 Donette Larry, CNM   1 tablet at 03/13/17 0954  . zolpidem (AMBIEN) tablet 5 mg  5 mg Oral QHS PRN Donette Larry, CNM        Labs:   Recent Labs Lab 03/11/17 2156 03/12/17 0344 03/13/17 0603  WBC 6.2 6.2 5.1  HGB 10.5* 10.2* 10.7*  HCT 30.5* 29.5* 31.7*  PLT 132* 138* 149*     Recent Labs Lab 03/11/17 2156 03/12/17 0344 03/13/17 0603  NA 135 133* 135  K 4.3 4.2 4.2  CL 108 107 108  CO2 20* 21* 22  BUN 14 13 14   CREATININE 0.65 0.62 0.75  CALCIUM 7.5* 7.1* 7.3*  PROT 4.9* 4.8* 5.0*  BILITOT 0.5 0.3 0.6  ALKPHOS 244* 237* 271*  ALT 32 30 26  AST 50* 42* 37  GLUCOSE 102* 87 70     Radiology: no new imaging  Assessment & Plan:  Pt stable *Pregnancy: routine care. Will put on EFM now and do BPP if still not reactive. Technically category I but no accels.  -neg 2hr GTT *Partial HELLP: continue with BP control per protocol, repeat labs 7/21 -s/p MFM consult *Preterm: s/p  BMZ on 7/15-16 -7/16: ceph, 1746gm, efw 69%, AC 73% -s/p NICU consult *GBS urine: tx in labor. TOC ordered *PPx: SCDs, OOB *FEN/GI: regular diet *Dispo: delivery at 34wks at the latests.   Cornelia Copa MD Attending Center for [facility])

## 2017-03-15 ENCOUNTER — Inpatient Hospital Stay (HOSPITAL_COMMUNITY): Payer: Commercial Managed Care - PPO

## 2017-03-15 LAB — COMPREHENSIVE METABOLIC PANEL
ALBUMIN: 1.8 g/dL — AB (ref 3.5–5.0)
ALK PHOS: 281 U/L — AB (ref 38–126)
ALT: 31 U/L (ref 14–54)
AST: 48 U/L — ABNORMAL HIGH (ref 15–41)
Anion gap: 6 (ref 5–15)
BUN: 14 mg/dL (ref 6–20)
CALCIUM: 7.6 mg/dL — AB (ref 8.9–10.3)
CO2: 20 mmol/L — AB (ref 22–32)
CREATININE: 0.82 mg/dL (ref 0.44–1.00)
Chloride: 107 mmol/L (ref 101–111)
GFR calc Af Amer: >60 mL/min (ref >60)
GFR calc non Af Amer: >60 mL/min (ref >60)
GLUCOSE: 65 mg/dL (ref 65–99)
Potassium: 4.7 mmol/L (ref 3.5–5.1)
SODIUM: 133 mmol/L — AB (ref 135–145)
Total Bilirubin: 0.9 mg/dL (ref 0.3–1.2)
Total Protein: 4.9 g/dL — ABNORMAL LOW (ref 6.5–8.1)

## 2017-03-15 LAB — CBC
HEMATOCRIT: 33.2 % — AB (ref 36.0–46.0)
HEMOGLOBIN: 11.3 g/dL — AB (ref 12.0–15.0)
MCH: 30.1 pg (ref 26.0–34.0)
MCHC: 34 g/dL (ref 30.0–36.0)
MCV: 88.3 fL (ref 78.0–100.0)
Platelets: 152 10*3/uL (ref 150–400)
RBC: 3.76 MIL/uL — AB (ref 3.87–5.11)
RDW: 14.4 % (ref 11.5–15.5)
WBC: 6.1 10*3/uL (ref 4.0–10.5)

## 2017-03-15 LAB — CULTURE, OB URINE: CULTURE: NO GROWTH

## 2017-03-15 MED ORDER — LACTATED RINGERS IV BOLUS (SEPSIS)
500.0000 mL | Freq: Once | INTRAVENOUS | Status: AC
  Administered 2017-03-15: 500 mL via INTRAVENOUS

## 2017-03-15 MED ORDER — LABETALOL HCL 200 MG PO TABS
800.0000 mg | ORAL_TABLET | Freq: Three times a day (TID) | ORAL | Status: DC
  Administered 2017-03-15 – 2017-03-17 (×5): 800 mg via ORAL
  Filled 2017-03-15 (×9): qty 4

## 2017-03-15 MED ORDER — LABETALOL HCL 200 MG PO TABS
800.0000 mg | ORAL_TABLET | Freq: Three times a day (TID) | ORAL | Status: DC
  Administered 2017-03-15 (×2): 800 mg via ORAL
  Filled 2017-03-15: qty 4

## 2017-03-15 MED ORDER — SODIUM CHLORIDE 0.9% FLUSH
3.0000 mL | Freq: Two times a day (BID) | INTRAVENOUS | Status: DC
  Administered 2017-03-15 – 2017-03-16 (×2): 3 mL via INTRAVENOUS

## 2017-03-15 NOTE — Progress Notes (Signed)
Patient ID: [name], female   DOB: 1991-03-27, 26 y.o.   MRN: [id number] ACULTY PRACTICE ANTEPARTUM COMPREHENSIVE PROGRESS NOTE  Misty Mckenzie is a 26 y.o. G2P0010 at [redacted]w[redacted]d  who is admitted for severe preeclampsia. Fetal presentation is cephalic. Length of Stay:  8  Days  Subjective: Reports blurry vision since last night. Denies HA , RUQ/epigastric pain. + FM.  LFTs (AST)  Increased to 57 last night but went back down to 48 this morning.    Vitals:  Blood pressure 133/86, pulse 89, temperature 98.7 F (37.1 C), temperature source Oral, resp. rate 18, height 5\' 5"  (1.651 m), weight 133 lb (60.3 kg), last menstrual period 08/06/2016, SpO2 99 %, unknown if currently breastfeeding.  Patient Vitals for the past 24 hrs:  BP Temp Temp src Pulse Resp SpO2  03/15/17 0944 133/86 - - 89 18 -  03/15/17 0849 (!) 157/101 - - 85 16 -  03/15/17 0800 (!) 172/100 98.7 F (37.1 C) Oral 74 18 99 %  03/15/17 0400 (!) 167/100 98.1 F (36.7 C) Oral 76 18 100 %  03/15/17 0000 (!) 152/95 98.7 F (37.1 C) Oral 77 18 98 %  03/14/17 2022 (!) 158/102 98.4 F (36.9 C) Oral 75 18 100 %  03/14/17 1638 (!) 148/102 98.2 F (36.8 C) Oral 84 18 100 %  03/14/17 1243 (!) 144/95 98.4 F (36.9 C) Oral 73 18 100 %    Physical Examination: Lungs clear Heart RRR Abd soft + BS gravid Ext trace edema, nl DTR's  Fetal Monitoring:  Baseline: 135 bpm, Variability: Good {> 6 bpm), Accelerations: Non-reactive but appropriate for gestational age and Decelerations: Absent,  No contractions  Labs:  CBC Latest Ref Rng & Units 03/15/2017 03/14/2017 03/13/2017  WBC 4.0 - 10.5 K/uL 6.1 5.6 5.1  Hemoglobin 12.0 - 15.0 g/dL 11.3(L) 10.8(L) 10.7(L)  Hematocrit 36.0 - 46.0 % 33.2(L) 32.2(L) 31.7(L)  Platelets 150 - 400 K/uL 152 148(L) 149(L)   . CMP Latest Ref Rng & Units 03/15/2017 03/14/2017 03/13/2017  Glucose 65 - 99 mg/dL 65 99 70  BUN 6 - 20 mg/dL 14 14 14   Creatinine 0.44 - 1.00 mg/dL 7.82 9.56 2.13  Sodium 135 - 145  mmol/L 133(L) 136 135  Potassium 3.5 - 5.1 mmol/L 4.7 4.1 4.2  Chloride 101 - 111 mmol/L 107 110 108  CO2 22 - 32 mmol/L 20(L) 21(L) 22  Calcium 8.9 - 10.3 mg/dL 7.6(L) 7.5(L) 7.3(L)  Total Protein 6.5 - 8.1 g/dL 4.9(L) 5.0(L) 5.0(L)  Total Bilirubin 0.3 - 1.2 mg/dL 0.9 0.5 0.6  Alkaline Phos 38 - 126 U/L 281(H) 279(H) 271(H)  AST 15 - 41 U/L 48(H) 57(H) 37  ALT 14 - 54 U/L 31 38 26     Imaging Studies:    none   Medications:  Scheduled . docusate sodium  100 mg Oral Daily  . labetalol  800 mg Oral TID  . labetalol  20 mg Intravenous Once  . prenatal multivitamin  1 tablet Oral Q1200   I have reviewed the patient's current medications.  ASSESSMENT: Patient Active Problem List   Diagnosis Date Noted  . Preeclampsia, severe, third trimester 03/07/2017  . GBS bacteriuria 11/04/2016  . Supervision of high-risk pregnancy 11/02/2016    PLAN: Severe range BP this morning, responded well to Labetalol. On Labetalol 800 tid. Stable BP currently. MFM scan today, will follow up results/MFM recommendations and manage accordingly.  Will deliver for maternal or fetal indications. Reassuring FWB for now Continue routine antenatal  care.   Jaynie CollinsUgonna Jarrod Mcenery, MD 03/15/2017,10:07 AM

## 2017-03-15 NOTE — Progress Notes (Signed)
Initial Nutrition Assessment  DOCUMENTATION CODES:  Not applicable  INTERVENTION:  Regular diet May order double protein portions, snacks TID and from retail  NUTRITION DIAGNOSIS:  Increased nutrient needs related to  (pregnancy and feteal growth rerquirements) as evidenced by  (31 weeks IUP).  GOAL:  Pt will meet 90 % of est needs  MONITOR:  Weight trends  REASON FOR ASSESSMENT:  Antenatal   ASSESSMENT:  31 4/7 weeks, preeclampsia. weight at initial pre-natal visit 105 lbs, BMI 17.5. 28 Lbs weight gain  Diet Order:  Diet regular Room service appropriate? Yes; Fluid consistency: Thin Height:   Ht Readings from Last 1 Encounters:  03/08/17 5\' 5"  (1.651 m)   Weight:   Wt Readings from Last 1 Encounters:  03/08/17 133 lb (60.3 kg)   Ideal Body Weight:    125 lbs  BMI:  Body mass index is 22.13 kg/m.  Estimated Nutritional Needs:   Kcal:  1600-1800  Protein:  75 -85 g  Fluid:  2 L  EDUCATION NEEDS:   No education needs identified at this time  Misty Mckenzie M.Odis LusterEd. R.D. LDN Neonatal Nutrition Support Specialist/RD III Pager [phone]      Phone [phone]

## 2017-03-16 LAB — COMPREHENSIVE METABOLIC PANEL
ALT: 28 U/L (ref 14–54)
AST: 37 U/L (ref 15–41)
Albumin: 1.6 g/dL — ABNORMAL LOW (ref 3.5–5.0)
Alkaline Phosphatase: 269 U/L — ABNORMAL HIGH (ref 38–126)
Anion gap: 6 (ref 5–15)
BILIRUBIN TOTAL: 0.6 mg/dL (ref 0.3–1.2)
BUN: 13 mg/dL (ref 6–20)
CHLORIDE: 109 mmol/L (ref 101–111)
CO2: 20 mmol/L — ABNORMAL LOW (ref 22–32)
Calcium: 7.7 mg/dL — ABNORMAL LOW (ref 8.9–10.3)
Creatinine, Ser: 0.81 mg/dL (ref 0.44–1.00)
Glucose, Bld: 73 mg/dL (ref 65–99)
POTASSIUM: 4.5 mmol/L (ref 3.5–5.1)
Sodium: 135 mmol/L (ref 135–145)
TOTAL PROTEIN: 4.7 g/dL — AB (ref 6.5–8.1)

## 2017-03-16 LAB — CBC
HEMATOCRIT: 32.2 % — AB (ref 36.0–46.0)
Hemoglobin: 11.1 g/dL — ABNORMAL LOW (ref 12.0–15.0)
MCH: 30.5 pg (ref 26.0–34.0)
MCHC: 34.5 g/dL (ref 30.0–36.0)
MCV: 88.5 fL (ref 78.0–100.0)
PLATELETS: 142 10*3/uL — AB (ref 150–400)
RBC: 3.64 MIL/uL — AB (ref 3.87–5.11)
RDW: 14.3 % (ref 11.5–15.5)
WBC: 5.5 10*3/uL (ref 4.0–10.5)

## 2017-03-16 LAB — TYPE AND SCREEN
ABO/RH(D): B POS
ANTIBODY SCREEN: NEGATIVE

## 2017-03-16 NOTE — Progress Notes (Signed)
[name], female   DOB: [birth year], 26 y.o.   MRN: [id number] ACULTY PRACTICE ANTEPARTUM COMPREHENSIVE PROGRESS NOTE  Misty Mckenzie is a 26 y.o. G2P0010 at [redacted]w[redacted]d who is admitted for severe preeclampsia. Fetal presentation is cephalic. Length of Stay:  9  Days  Subjective: Reports blurry vision since last night. Denies HA , RUQ/epigastric pain. + FM.   Vitals:  Blood pressure (!) 155/101, pulse 80, temperature 98.2 F (36.8 C), temperature source Oral, resp. rate 18, height 5\' 5"  (1.651 m), weight 133 lb (60.3 kg), last menstrual period 08/06/2016, SpO2 98 %, unknown if currently breastfeeding.  Patient Vitals for the past 24 hrs:  BP Temp Temp src Pulse Resp SpO2  03/16/17 0822 (!) 155/101 98.2 F (36.8 C) Oral 80 18 98 %  03/16/17 0521 (!) 150/93 98.2 F (36.8 C) Oral 75 18 98 %  03/16/17 0006 (!) 160/100 98.2 F (36.8 C) Oral 81 18 99 %  03/15/17 2008 (!) 146/100 97.6 F (36.4 C) Oral 69 16 98 %  03/15/17 1739 - - - - - 98 %  03/15/17 1738 127/83 98.1 F (36.7 C) Oral 84 17 99 %  03/15/17 1608 (!) 141/97 - - 95 18 100 %  03/15/17 1200 (!) 144/97 - - 77 18 98 %  03/15/17 0944 133/86 - - 89 18 -    Physical Examination: Lungs clear Heart RRR Abd soft + BS gravid Ext trace edema, nl DTR's  Fetal Monitoring:  Baseline: 125 bpm, Variability: Moderate {> 6 bpm), Accelerations: Non-reactive but appropriate for gestational age and Decelerations: Absent,  No contractions  Labs:  CBC Latest Ref Rng & Units 03/16/2017 03/15/2017 03/14/2017  WBC 4.0 - 10.5 K/uL 5.5 6.1 5.6  Hemoglobin 12.0 - 15.0 g/dL 11.1(L) 11.3(L) 10.8(L)  Hematocrit 36.0 - 46.0 % 32.2(L) 33.2(L) 32.2(L)  Platelets 150 - 400 K/uL 142(L) 152 148(L)   . CMP Latest Ref Rng & Units 03/16/2017 03/15/2017 03/14/2017  Glucose 65 - 99 mg/dL 73 65 99  BUN 6 - 20 mg/dL 13 14 14   Creatinine 0.44 - 1.00 mg/dL 4.09 8.11 9.14  Sodium 135 - 145 mmol/L 135 133(L) 136  Potassium 3.5 - 5.1 mmol/L 4.5 4.7 4.1   Chloride 101 - 111 mmol/L 109 107 110  CO2 22 - 32 mmol/L 20(L) 20(L) 21(L)  Calcium 8.9 - 10.3 mg/dL 7.7(L) 7.6(L) 7.5(L)  Total Protein 6.5 - 8.1 g/dL 4.7(L) 4.9(L) 5.0(L)  Total Bilirubin 0.3 - 1.2 mg/dL 0.6 0.9 0.5  Alkaline Phos 38 - 126 U/L 269(H) 281(H) 279(H)  AST 15 - 41 U/L 37 48(H) 57(H)  ALT 14 - 54 U/L 28 31 38     Imaging Studies:    [place] Mfm Ob Follow Up  Result Date: 03/08/2017 ----------------------------------------------------------------------  OBSTETRICS REPORT                      (Signed Final 03/08/2017 08:41 pm) ---------------------------------------------------------------------- Patient Info  ID #:       [id number]                         D.O.B.:   01/25/1991 (25 yrs)  Name:       Misty Mckenzie                 Visit Date:  03/08/2017 04:13 pm ---------------------------------------------------------------------- Performed By  Performed By:     Eden Lathe BS      Ref. Address:  [address]                    [facility]                                                             [address]                                                             [place], [place]                                                             [id number]  Attending:        Particia NearingMartha Decker MD       Secondary Phy.:   3rd Nursing- HR                                                             OB                                                             3rd Floor  Referred By:      Misty GinPEGGY                  [facility]                    CONSTANT MD ---------------------------------------------------------------------- Orders   #  Description                                 Code   1  US MFM OB FOLLOW UP                         [id number]  ----------------------------------------------------------------------   #  Ordered By               Order #        Accession #    Episode #   1  Donette LarryMELANIE BHAMBRI          [id number]      [id number]     [id number]   ---------------------------------------------------------------------- Indications   [redacted] weeks gestation of pregnancy                Z3A.30   Severe preeclampsia, third trimester           O14.13   Encounter  for other antenatal screening        Z36.2   follow-up  ---------------------------------------------------------------------- OB History  Gravidity:    2         Term:   0        Prem:   0        SAB:   0  TOP:          0       Ectopic:  1        Living: 0 ---------------------------------------------------------------------- Fetal Evaluation  Num Of Fetuses:     1  Fetal Heart         150  Rate(bpm):  Cardiac Activity:   Observed  Presentation:       Cephalic  Placenta:           Anterior, above cervical os  P. Cord Insertion:  Visualized, central  Amniotic Fluid  AFI FV:      Subjectively within normal limits  AFI Sum(cm)     %Tile       Largest Pocket(cm)  13.12           39          5.64  RUQ(cm)       RLQ(cm)       LUQ(cm)        LLQ(cm)  3.99          1.53          1.96           5.64 ---------------------------------------------------------------------- Biometry  BPD:      78.7  mm     G. Age:  31w 4d         70  %    CI:        77.89   %   70 - 86                                                          FL/HC:      21.2   %   19.3 - 21.3  HC:      282.2  mm     G. Age:  30w 6d         24  %    HC/AC:      1.03       0.96 - 1.17  AC:      274.2  mm     G. Age:  31w 4d         73  %    FL/BPD:     76.0   %   71 - 87  FL:       59.8  mm     G. Age:  31w 1d         52  %    FL/AC:      21.8   %   20 - 24  HUM:      54.3  mm     G. Age:  31w 4d         72  %  Est. FW:    1746  gm    3 lb 14 oz      69  % ---------------------------------------------------------------------- Gestational Age  LMP:  30w 4d       Date:   08/06/16                 EDD:   05/13/17  U/S Today:     31w 2d                                        EDD:   05/08/17  Best:          30w 4d    Det. By:   LMP  (08/06/16)          EDD:    05/13/17 ---------------------------------------------------------------------- Anatomy  Cranium:               Appears normal         Aortic Arch:            Appears normal  Cavum:                 Appears normal         Ductal Arch:            Previously seen  Ventricles:            Appears normal         Diaphragm:              Appears normal  Choroid Plexus:        Appears normal         Stomach:                Appears normal, left                                                                        sided  Cerebellum:            Appears normal         Abdomen:                Appears normal  Posterior Fossa:       Appears normal         Abdominal Wall:         Previously seen  Nuchal Fold:           Previously seen        Cord Vessels:           Previously seen  Face:                  Appears normal         Kidneys:                Appear normal                         (orbits and profile)  Lips:                  Appears normal         Bladder:                Appears normal  Thoracic:  Appears normal         Spine:                  Previously seen  Heart:                 Previously seen        Upper Extremities:      Previously seen  RVOT:                  Previously seen        Lower Extremities:      Previously seen  LVOT:                  Previously seen  Other:  Fetus appears to be a female. Nasal bone visualized. Heels and 5th          digit previously visualized. ---------------------------------------------------------------------- Cervix Uterus Adnexa  Cervix  Not visualized (advanced GA >29wks)  Uterus  No abnormality visualized.  Left Ovary  Within normal limits.  Right Ovary  Within normal limits.  Cul De Sac:   No free fluid seen.  Adnexa:       No abnormality visualized. ---------------------------------------------------------------------- Impression  SIUP at 30+4 weeks  Normal interval anatomy; anatomic survey complete  Normal amniotic fluid volume  Appropriate interval growth with EFW  at the 69th %tile ---------------------------------------------------------------------- Recommendations  Follow-up ultrasound for growth in  2-3 weeks ----------------------------------------------------------------------                 Particia Nearing, MD Electronically Signed Final Report   03/08/2017 08:41 pm ----------------------------------------------------------------------  [place] Mfm Ob Limited  Result Date: 03/16/2017 ----------------------------------------------------------------------  OBSTETRICS REPORT                       (Signed Final 03/16/2017 01:37 am) ---------------------------------------------------------------------- Patient Info  ID #:       [id number]                          D.O.B.:  1990-10-01 (25 yrs)  Name:       Misty Mckenzie                  Visit Date: 03/15/2017 04:47 pm ---------------------------------------------------------------------- Performed By  Performed By:     Vivien Rota        Ref. Address:      [address]                                                              [place], [place]  [address]  Attending:        Particia Nearing MD       Secondary Phy.:    3rd Nursing- HR                                                              OB                                                              3rd Floor  Referred By:      Misty Mckenzie                  Location:          [facility]                    CONSTANT MD ---------------------------------------------------------------------- Orders   #  Description                                 Code   1  [place] MFM OB LIMITED                           [id number]  ----------------------------------------------------------------------   #  Ordered By               Order #        Accession #    Episode #   1  Osnabrock Bing          [id number]      [id number]     [id number]   ---------------------------------------------------------------------- Indications   [redacted] weeks gestation of pregnancy                Z3A.31   Severe preeclampsia, third trimester           O14.13  ---------------------------------------------------------------------- OB History  Gravidity:    2         Term:   0        Prem:   0         SAB:   0  TOP:          0       Ectopic:  1        Living: 0 ---------------------------------------------------------------------- Fetal Evaluation  Num Of Fetuses:     1  Fetal Heart         140  Rate(bpm):  Cardiac Activity:   Observed  Presentation:       Cephalic  Amniotic Fluid  AFI FV:      Subjectively within normal limits  AFI Sum(cm)     %Tile       Largest Pocket(cm)  12.55           35          4.22  RUQ(cm)                     LUQ(cm)        LLQ(cm)  4.22  4.11           4.22 ---------------------------------------------------------------------- Gestational Age  LMP:           31w 4d        Date:  08/06/16                 EDD:    05/13/17  Best:          31w 4d     Det. By:  LMP  (08/06/16)          EDD:    05/13/17 ---------------------------------------------------------------------- Impression  SIUP at 31+4 weeks  Cephalic presentation  Normal amniotic fluid volume ---------------------------------------------------------------------- Recommendations  Follow-up as clinically indicated ----------------------------------------------------------------------                 Particia Nearing, MD Electronically Signed Final Report   03/16/2017 01:37 am ----------------------------------------------------------------------   Medications:  Scheduled . docusate sodium  100 mg Oral Daily  . labetalol  800 mg Oral Q8H  . labetalol  20 mg Intravenous Once  . prenatal multivitamin  1 tablet Oral Q1200  . sodium chloride flush  3 mL Intravenous Q12H   I have reviewed the patient's current medications.  ASSESSMENT: Patient Active Problem List    Diagnosis Date Noted  . Preeclampsia, severe, third trimester 03/07/2017  . GBS bacteriuria 11/04/2016  . Supervision of high-risk pregnancy 11/02/2016    PLAN: Stable BP, on Labetalol 800 tid. Stable BP currently.   Will deliver for maternal or fetal indications. Reassuring FWB for now Continue routine antenatal care.   Jaynie Collins, MD 03/16/2017,8:50 AM

## 2017-03-17 ENCOUNTER — Inpatient Hospital Stay (HOSPITAL_COMMUNITY): Payer: Commercial Managed Care - PPO | Admitting: Anesthesiology

## 2017-03-17 ENCOUNTER — Encounter (HOSPITAL_COMMUNITY)
Admission: AD | Disposition: A | Discharge status: Home / Self Care | Payer: Self-pay | Source: Ambulatory Visit | Attending: Obstetrics and Gynecology

## 2017-03-17 ENCOUNTER — Encounter (HOSPITAL_COMMUNITY): Payer: Self-pay | Admitting: *Deleted

## 2017-03-17 DIAGNOSIS — O1414 Severe pre-eclampsia complicating childbirth: Secondary | ICD-10-CM

## 2017-03-17 DIAGNOSIS — Z3A31 31 weeks gestation of pregnancy: Secondary | ICD-10-CM

## 2017-03-17 DIAGNOSIS — O368390 Maternal care for abnormalities of the fetal heart rate or rhythm, unspecified trimester, not applicable or unspecified: Secondary | ICD-10-CM | POA: Diagnosis not present

## 2017-03-17 DIAGNOSIS — Z98891 History of uterine scar from previous surgery: Secondary | ICD-10-CM

## 2017-03-17 LAB — CBC
HCT: 33.2 % — ABNORMAL LOW (ref 36.0–46.0)
HEMATOCRIT: 35.6 % — AB (ref 36.0–46.0)
HEMATOCRIT: 35 % — AB (ref 36.0–46.0)
Hemoglobin: 11.3 g/dL — ABNORMAL LOW (ref 12.0–15.0)
Hemoglobin: 12.1 g/dL (ref 12.0–15.0)
Hemoglobin: 11.9 g/dL — ABNORMAL LOW (ref 12.0–15.0)
MCH: 30 pg (ref 26.0–34.0)
MCH: 30 pg (ref 26.0–34.0)
MCH: 30.1 pg (ref 26.0–34.0)
MCHC: 34 g/dL (ref 30.0–36.0)
MCHC: 34 g/dL (ref 30.0–36.0)
MCHC: 34 g/dL (ref 30.0–36.0)
MCV: 88.1 fL (ref 78.0–100.0)
MCV: 88.1 fL (ref 78.0–100.0)
MCV: 88.6 fL (ref 78.0–100.0)
PLATELETS: 164 10*3/uL (ref 150–400)
PLATELETS: 160 10*3/uL (ref 150–400)
PLATELETS: 163 10*3/uL (ref 150–400)
RBC: 3.77 MIL/uL — AB (ref 3.87–5.11)
RBC: 4.04 MIL/uL (ref 3.87–5.11)
RBC: 3.95 MIL/uL (ref 3.87–5.11)
RDW: 14.5 % (ref 11.5–15.5)
RDW: 14.4 % (ref 11.5–15.5)
RDW: 14.3 % (ref 11.5–15.5)
WBC: 5.8 10*3/uL (ref 4.0–10.5)
WBC: 6 10*3/uL (ref 4.0–10.5)
WBC: 9.7 10*3/uL (ref 4.0–10.5)

## 2017-03-17 LAB — FIBRINOGEN: Fibrinogen: 531 mg/dL — ABNORMAL HIGH (ref 210–475)

## 2017-03-17 LAB — COMPREHENSIVE METABOLIC PANEL
ALBUMIN: 1.7 g/dL — AB (ref 3.5–5.0)
ALBUMIN: 1.7 g/dL — AB (ref 3.5–5.0)
ALK PHOS: 278 U/L — AB (ref 38–126)
ALT: 26 U/L (ref 14–54)
ALT: 26 U/L (ref 14–54)
ANION GAP: 6 (ref 5–15)
AST: 44 U/L — AB (ref 15–41)
AST: 41 U/L (ref 15–41)
Alkaline Phosphatase: 290 U/L — ABNORMAL HIGH (ref 38–126)
Anion gap: 5 (ref 5–15)
BILIRUBIN TOTAL: 0.5 mg/dL (ref 0.3–1.2)
BUN: 13 mg/dL (ref 6–20)
BUN: 12 mg/dL (ref 6–20)
CALCIUM: 7.6 mg/dL — AB (ref 8.9–10.3)
CHLORIDE: 111 mmol/L (ref 101–111)
CO2: 19 mmol/L — ABNORMAL LOW (ref 22–32)
CO2: 21 mmol/L — AB (ref 22–32)
CREATININE: 0.91 mg/dL (ref 0.44–1.00)
Calcium: 7.8 mg/dL — ABNORMAL LOW (ref 8.9–10.3)
Chloride: 111 mmol/L (ref 101–111)
Creatinine, Ser: 0.86 mg/dL (ref 0.44–1.00)
GFR calc Af Amer: >60 mL/min (ref >60)
GFR calc Af Amer: >60 mL/min (ref >60)
GFR calc non Af Amer: >60 mL/min (ref >60)
Glucose, Bld: 81 mg/dL (ref 65–99)
Glucose, Bld: 72 mg/dL (ref 65–99)
POTASSIUM: 4.4 mmol/L (ref 3.5–5.1)
Potassium: 5 mmol/L (ref 3.5–5.1)
SODIUM: 137 mmol/L (ref 135–145)
Sodium: 136 mmol/L (ref 135–145)
TOTAL PROTEIN: 4.2 g/dL — AB (ref 6.5–8.1)
Total Bilirubin: 0.3 mg/dL (ref 0.3–1.2)
Total Protein: 5 g/dL — ABNORMAL LOW (ref 6.5–8.1)

## 2017-03-17 LAB — APTT: aPTT: 31 seconds (ref 24–36)

## 2017-03-17 LAB — PROTIME-INR
INR: 0.89
Prothrombin Time: 12.1 seconds (ref 11.4–15.2)

## 2017-03-17 LAB — SAVE SMEAR

## 2017-03-17 LAB — PLATELET COUNT: Platelets: 164 10*3/uL (ref 150–400)

## 2017-03-17 SURGERY — Surgical Case
Anesthesia: Spinal

## 2017-03-17 MED ORDER — COCONUT OIL OIL
1.0000 "application " | TOPICAL_OIL | Status: DC | PRN
  Filled 2017-03-17: qty 120

## 2017-03-17 MED ORDER — MORPHINE SULFATE (PF) 0.5 MG/ML IJ SOLN
INTRAMUSCULAR | Status: DC | PRN
  Administered 2017-03-17: .2 mg via INTRATHECAL

## 2017-03-17 MED ORDER — OXYTOCIN 40 UNITS IN LACTATED RINGERS INFUSION - SIMPLE MED: 2.5000 [IU]/h | INTRAVENOUS | Status: AC

## 2017-03-17 MED ORDER — OXYTOCIN 40 UNITS IN LACTATED RINGERS INFUSION - SIMPLE MED: 1.0000 m[IU]/min | INTRAVENOUS | Status: DC

## 2017-03-17 MED ORDER — LACTATED RINGERS IV SOLN
INTRAVENOUS | Status: DC | PRN
Start: 2017-03-17 — End: 2017-03-17
  Administered 2017-03-17: 16:00:00 via INTRAVENOUS

## 2017-03-17 MED ORDER — FENTANYL 2.5 MCG/ML BUPIVACAINE 1/10 % EPIDURAL INFUSION (WH - ANES): 14.0000 mL/h | INTRAMUSCULAR | Status: DC | PRN

## 2017-03-17 MED ORDER — MAGNESIUM SULFATE 40 G IN LACTATED RINGERS - SIMPLE
2.0000 g/h | INTRAVENOUS | Status: AC
  Administered 2017-03-18: 2 g/h via INTRAVENOUS
  Filled 2017-03-17 (×2): qty 500

## 2017-03-17 MED ORDER — SCOPOLAMINE 1 MG/3DAYS TD PT72
MEDICATED_PATCH | TRANSDERMAL | Status: AC
Start: 2017-03-17 — End: 2017-03-17
  Filled 2017-03-17: qty 1

## 2017-03-17 MED ORDER — MENTHOL 3 MG MT LOZG: 1.0000 | LOZENGE | OROMUCOSAL | Status: DC | PRN

## 2017-03-17 MED ORDER — LACTATED RINGERS IV SOLN: INTRAVENOUS | Status: DC

## 2017-03-17 MED ORDER — SODIUM CHLORIDE 0.9% FLUSH: 3.0000 mL | INTRAVENOUS | Status: DC | PRN

## 2017-03-17 MED ORDER — BUPIVACAINE IN DEXTROSE 0.75-8.25 % IT SOLN
INTRATHECAL | Status: AC
  Filled 2017-03-17: qty 2

## 2017-03-17 MED ORDER — SCOPOLAMINE 1 MG/3DAYS TD PT72
MEDICATED_PATCH | TRANSDERMAL | Status: DC | PRN
  Administered 2017-03-17: 1 via TRANSDERMAL

## 2017-03-17 MED ORDER — MEPERIDINE HCL 25 MG/ML IJ SOLN: 6.2500 mg | INTRAMUSCULAR | Status: DC | PRN

## 2017-03-17 MED ORDER — LACTATED RINGERS IV SOLN
INTRAVENOUS | Status: DC | PRN
  Administered 2017-03-17: 16:00:00 via INTRAVENOUS

## 2017-03-17 MED ORDER — PHENYLEPHRINE 8 MG IN D5W 100 ML (0.08MG/ML) PREMIX OPTIME
INJECTION | INTRAVENOUS | Status: AC
  Filled 2017-03-17: qty 100

## 2017-03-17 MED ORDER — OXYTOCIN 10 UNIT/ML IJ SOLN
INTRAMUSCULAR | Status: AC
  Filled 2017-03-17: qty 4

## 2017-03-17 MED ORDER — LACTATED RINGERS IV SOLN: 500.0000 mL | INTRAVENOUS | Status: DC | PRN

## 2017-03-17 MED ORDER — ONDANSETRON HCL 4 MG/2ML IJ SOLN
INTRAMUSCULAR | Status: AC
  Filled 2017-03-17: qty 2

## 2017-03-17 MED ORDER — EPHEDRINE 5 MG/ML INJ: 10.0000 mg | INTRAVENOUS | Status: DC | PRN

## 2017-03-17 MED ORDER — SENNOSIDES-DOCUSATE SODIUM 8.6-50 MG PO TABS
2.0000 | ORAL_TABLET | ORAL | Status: DC
  Administered 2017-03-19: 2 via ORAL
  Filled 2017-03-17 (×3): qty 2

## 2017-03-17 MED ORDER — SIMETHICONE 80 MG PO CHEW
80.0000 mg | CHEWABLE_TABLET | ORAL | Status: DC
  Administered 2017-03-18 – 2017-03-19 (×2): 80 mg via ORAL
  Filled 2017-03-17 (×3): qty 1

## 2017-03-17 MED ORDER — ZOLPIDEM TARTRATE 5 MG PO TABS: 5.0000 mg | ORAL_TABLET | Freq: Every evening | ORAL | Status: DC | PRN

## 2017-03-17 MED ORDER — METOCLOPRAMIDE HCL 5 MG/ML IJ SOLN
INTRAMUSCULAR | Status: DC | PRN
  Administered 2017-03-17: 10 mg via INTRAVENOUS

## 2017-03-17 MED ORDER — FENTANYL CITRATE (PF) 100 MCG/2ML IJ SOLN
INTRAMUSCULAR | Status: AC
  Filled 2017-03-17: qty 2

## 2017-03-17 MED ORDER — MAGNESIUM SULFATE BOLUS VIA INFUSION
4.0000 g | Freq: Once | INTRAVENOUS | Status: AC
  Administered 2017-03-17: 4 g via INTRAVENOUS
  Filled 2017-03-17: qty 500

## 2017-03-17 MED ORDER — NALBUPHINE SYRINGE 5 MG/0.5 ML
5.0000 mg | INJECTION | INTRAMUSCULAR | Status: DC | PRN
  Filled 2017-03-17: qty 0.5

## 2017-03-17 MED ORDER — DIPHENHYDRAMINE HCL 50 MG/ML IJ SOLN: 12.5000 mg | INTRAMUSCULAR | Status: DC | PRN

## 2017-03-17 MED ORDER — KETOROLAC TROMETHAMINE 30 MG/ML IJ SOLN
INTRAMUSCULAR | Status: AC
  Filled 2017-03-17: qty 1

## 2017-03-17 MED ORDER — PHENYLEPHRINE 8 MG IN D5W 100 ML (0.08MG/ML) PREMIX OPTIME: INJECTION | INTRAVENOUS | Status: DC | PRN

## 2017-03-17 MED ORDER — MAGNESIUM HYDROXIDE 400 MG/5ML PO SUSP: 30.0000 mL | ORAL | Status: DC | PRN

## 2017-03-17 MED ORDER — CEFAZOLIN SODIUM-DEXTROSE 2-4 GM/100ML-% IV SOLN
INTRAVENOUS | Status: AC
  Filled 2017-03-17: qty 100

## 2017-03-17 MED ORDER — PHENYLEPHRINE 40 MCG/ML (10ML) SYRINGE FOR IV PUSH (FOR BLOOD PRESSURE SUPPORT): 80.0000 ug | PREFILLED_SYRINGE | INTRAVENOUS | Status: DC | PRN

## 2017-03-17 MED ORDER — PRENATAL MULTIVITAMIN CH
1.0000 | ORAL_TABLET | Freq: Every day | ORAL | Status: DC
  Administered 2017-03-18 – 2017-03-19 (×2): 1 via ORAL
  Filled 2017-03-17 (×2): qty 1

## 2017-03-17 MED ORDER — OXYTOCIN BOLUS FROM INFUSION: 500.0000 mL | Freq: Once | INTRAVENOUS | Status: DC

## 2017-03-17 MED ORDER — NALBUPHINE SYRINGE 5 MG/0.5 ML
5.0000 mg | INJECTION | Freq: Once | INTRAMUSCULAR | Status: DC | PRN
  Filled 2017-03-17: qty 0.5

## 2017-03-17 MED ORDER — CEFAZOLIN SODIUM-DEXTROSE 2-3 GM-% IV SOLR
INTRAVENOUS | Status: DC | PRN
  Administered 2017-03-17: 2 g via INTRAVENOUS

## 2017-03-17 MED ORDER — NALOXONE HCL 2 MG/2ML IJ SOSY
1.0000 ug/kg/h | PREFILLED_SYRINGE | INTRAVENOUS | Status: DC | PRN
  Filled 2017-03-17: qty 2

## 2017-03-17 MED ORDER — TETANUS-DIPHTH-ACELL PERTUSSIS 5-2.5-18.5 LF-MCG/0.5 IM SUSP: 0.5000 mL | Freq: Once | INTRAMUSCULAR | Status: DC

## 2017-03-17 MED ORDER — FLEET ENEMA 7-19 GM/118ML RE ENEM: 1.0000 | ENEMA | Freq: Every day | RECTAL | Status: DC | PRN

## 2017-03-17 MED ORDER — DIPHENHYDRAMINE HCL 25 MG PO CAPS
25.0000 mg | ORAL_CAPSULE | ORAL | Status: DC | PRN
  Filled 2017-03-17: qty 1

## 2017-03-17 MED ORDER — MORPHINE SULFATE (PF) 0.5 MG/ML IJ SOLN
INTRAMUSCULAR | Status: AC
  Filled 2017-03-17: qty 10

## 2017-03-17 MED ORDER — FENTANYL CITRATE (PF) 100 MCG/2ML IJ SOLN: 50.0000 ug | INTRAMUSCULAR | Status: DC | PRN

## 2017-03-17 MED ORDER — TERBUTALINE SULFATE 1 MG/ML IJ SOLN: 0.2500 mg | Freq: Once | INTRAMUSCULAR | Status: DC | PRN

## 2017-03-17 MED ORDER — LACTATED RINGERS IV SOLN: 500.0000 mL | Freq: Once | INTRAVENOUS | Status: DC

## 2017-03-17 MED ORDER — OXYCODONE-ACETAMINOPHEN 5-325 MG PO TABS: 2.0000 | ORAL_TABLET | ORAL | Status: DC | PRN

## 2017-03-17 MED ORDER — HYDRALAZINE HCL 20 MG/ML IJ SOLN
5.0000 mg | INTRAMUSCULAR | Status: DC | PRN
  Administered 2017-03-17: 5 mg via INTRAVENOUS
  Filled 2017-03-17: qty 1

## 2017-03-17 MED ORDER — LABETALOL HCL 5 MG/ML IV SOLN
20.0000 mg | INTRAVENOUS | Status: DC | PRN
  Administered 2017-03-17: 20 mg via INTRAVENOUS
  Filled 2017-03-17: qty 8
  Filled 2017-03-17: qty 4

## 2017-03-17 MED ORDER — LABETALOL HCL 200 MG PO TABS
800.0000 mg | ORAL_TABLET | Freq: Three times a day (TID) | ORAL | Status: DC
  Administered 2017-03-17 (×2): 800 mg via ORAL
  Filled 2017-03-17 (×2): qty 4

## 2017-03-17 MED ORDER — IBUPROFEN 600 MG PO TABS
600.0000 mg | ORAL_TABLET | Freq: Four times a day (QID) | ORAL | Status: DC
  Administered 2017-03-18 – 2017-03-20 (×9): 600 mg via ORAL
  Filled 2017-03-17 (×9): qty 1

## 2017-03-17 MED ORDER — HYDROXYZINE HCL 50 MG PO TABS: 50.0000 mg | ORAL_TABLET | Freq: Four times a day (QID) | ORAL | Status: DC | PRN

## 2017-03-17 MED ORDER — BUPIVACAINE HCL (PF) 0.5 % IJ SOLN
INTRAMUSCULAR | Status: AC
  Filled 2017-03-17: qty 30

## 2017-03-17 MED ORDER — NALOXONE HCL 0.4 MG/ML IJ SOLN
0.4000 mg | INTRAMUSCULAR | Status: DC | PRN
Start: 2017-03-17 — End: 2017-03-20

## 2017-03-17 MED ORDER — SIMETHICONE 80 MG PO CHEW: 80.0000 mg | CHEWABLE_TABLET | ORAL | Status: DC | PRN

## 2017-03-17 MED ORDER — ONDANSETRON HCL 4 MG/2ML IJ SOLN: 4.0000 mg | Freq: Three times a day (TID) | INTRAMUSCULAR | Status: DC | PRN

## 2017-03-17 MED ORDER — OXYCODONE-ACETAMINOPHEN 5-325 MG PO TABS: 1.0000 | ORAL_TABLET | ORAL | Status: DC | PRN

## 2017-03-17 MED ORDER — FERROUS SULFATE 325 (65 FE) MG PO TABS
325.0000 mg | ORAL_TABLET | Freq: Two times a day (BID) | ORAL | Status: DC
  Administered 2017-03-18 – 2017-03-20 (×5): 325 mg via ORAL
  Filled 2017-03-17 (×5): qty 1

## 2017-03-17 MED ORDER — OXYCODONE-ACETAMINOPHEN 5-325 MG PO TABS
2.0000 | ORAL_TABLET | ORAL | Status: DC | PRN
  Administered 2017-03-18 – 2017-03-19 (×2): 2 via ORAL
  Filled 2017-03-17 (×2): qty 2

## 2017-03-17 MED ORDER — LACTATED RINGERS IV SOLN
INTRAVENOUS | Status: DC
  Administered 2017-03-17: 10:00:00 via INTRAVENOUS

## 2017-03-17 MED ORDER — DIPHENHYDRAMINE HCL 25 MG PO CAPS: 25.0000 mg | ORAL_CAPSULE | Freq: Four times a day (QID) | ORAL | Status: DC | PRN

## 2017-03-17 MED ORDER — BUPIVACAINE IN DEXTROSE 0.75-8.25 % IT SOLN
INTRATHECAL | Status: DC | PRN
  Administered 2017-03-17: 1.6 mL via INTRATHECAL

## 2017-03-17 MED ORDER — LIDOCAINE HCL (PF) 1 % IJ SOLN: 30.0000 mL | INTRAMUSCULAR | Status: DC | PRN

## 2017-03-17 MED ORDER — SCOPOLAMINE 1 MG/3DAYS TD PT72
1.0000 | MEDICATED_PATCH | Freq: Once | TRANSDERMAL | Status: DC
  Administered 2017-03-17: 1.5 mg via TRANSDERMAL

## 2017-03-17 MED ORDER — BUPIVACAINE HCL (PF) 0.5 % IJ SOLN
INTRAMUSCULAR | Status: DC | PRN
  Administered 2017-03-17: 30 mL

## 2017-03-17 MED ORDER — MAGNESIUM SULFATE 40 G IN LACTATED RINGERS - SIMPLE
2.0000 g/h | INTRAVENOUS | Status: DC
  Filled 2017-03-17: qty 500

## 2017-03-17 MED ORDER — PENICILLIN G POT IN DEXTROSE 60000 UNIT/ML IV SOLN
3.0000 10*6.[IU] | INTRAVENOUS | Status: DC
  Filled 2017-03-17 (×4): qty 50

## 2017-03-17 MED ORDER — LACTATED RINGERS IV SOLN
INTRAVENOUS | Status: DC
  Administered 2017-03-18 (×2): via INTRAVENOUS

## 2017-03-17 MED ORDER — PHENYLEPHRINE HCL 10 MG/ML IJ SOLN
INTRAMUSCULAR | Status: DC | PRN
  Administered 2017-03-17 (×2): 40 ug via INTRAVENOUS
  Administered 2017-03-17: 80 ug via INTRAVENOUS

## 2017-03-17 MED ORDER — PENICILLIN G POTASSIUM 5000000 UNITS IJ SOLR
5.0000 10*6.[IU] | Freq: Once | INTRAVENOUS | Status: DC
  Filled 2017-03-17: qty 5

## 2017-03-17 MED ORDER — FENTANYL CITRATE (PF) 100 MCG/2ML IJ SOLN
INTRAMUSCULAR | Status: DC | PRN
  Administered 2017-03-17: 10 ug via INTRATHECAL

## 2017-03-17 MED ORDER — KETOROLAC TROMETHAMINE 30 MG/ML IJ SOLN: 30.0000 mg | Freq: Four times a day (QID) | INTRAMUSCULAR | Status: AC | PRN

## 2017-03-17 MED ORDER — DIBUCAINE 1 % RE OINT: 1.0000 "application " | TOPICAL_OINTMENT | RECTAL | Status: DC | PRN

## 2017-03-17 MED ORDER — KETOROLAC TROMETHAMINE 30 MG/ML IJ SOLN
30.0000 mg | Freq: Once | INTRAMUSCULAR | Status: AC
  Administered 2017-03-17: 30 mg via INTRAMUSCULAR

## 2017-03-17 MED ORDER — PROMETHAZINE HCL 25 MG/ML IJ SOLN: 6.2500 mg | INTRAMUSCULAR | Status: DC | PRN

## 2017-03-17 MED ORDER — MISOPROSTOL 25 MCG QUARTER TABLET
25.0000 ug | ORAL_TABLET | ORAL | Status: DC | PRN
  Administered 2017-03-17: 25 ug via VAGINAL
  Filled 2017-03-17: qty 1

## 2017-03-17 MED ORDER — WITCH HAZEL-GLYCERIN EX PADS: 1.0000 "application " | MEDICATED_PAD | CUTANEOUS | Status: DC | PRN

## 2017-03-17 MED ORDER — OXYTOCIN 40 UNITS IN LACTATED RINGERS INFUSION - SIMPLE MED
2.5000 [IU]/h | INTRAVENOUS | Status: DC
  Administered 2017-03-17: 40 mL via INTRAVENOUS

## 2017-03-17 MED ORDER — FENTANYL CITRATE (PF) 100 MCG/2ML IJ SOLN: 25.0000 ug | INTRAMUSCULAR | Status: DC | PRN

## 2017-03-17 MED ORDER — ONDANSETRON HCL 4 MG/2ML IJ SOLN
4.0000 mg | Freq: Four times a day (QID) | INTRAMUSCULAR | Status: DC | PRN
  Administered 2017-03-17 (×2): 4 mg via INTRAVENOUS
  Filled 2017-03-17: qty 2

## 2017-03-17 MED ORDER — MEASLES, MUMPS & RUBELLA VAC ~~LOC~~ INJ
0.5000 mL | INJECTION | Freq: Once | SUBCUTANEOUS | Status: DC
  Filled 2017-03-17: qty 0.5

## 2017-03-17 MED ORDER — ACETAMINOPHEN 325 MG PO TABS: 650.0000 mg | ORAL_TABLET | ORAL | Status: DC | PRN

## 2017-03-17 MED ORDER — SOD CITRATE-CITRIC ACID 500-334 MG/5ML PO SOLN
30.0000 mL | ORAL | Status: DC | PRN
  Administered 2017-03-17: 30 mL via ORAL
  Filled 2017-03-17: qty 15

## 2017-03-17 SURGICAL SUPPLY — 32 items
BENZOIN TINCTURE PRP APPL 2/3 (GAUZE/BANDAGES/DRESSINGS) ×3 IMPLANT
CHLORAPREP W/TINT 26ML (MISCELLANEOUS) ×3 IMPLANT
CLAMP CORD UMBIL (MISCELLANEOUS) IMPLANT
CLOSURE STERI STRIP 1/2 X4 (GAUZE/BANDAGES/DRESSINGS) ×3 IMPLANT
CLOTH BEACON ORANGE TIMEOUT ST (SAFETY) ×3 IMPLANT
DRSG OPSITE POSTOP 4X10 (GAUZE/BANDAGES/DRESSINGS) ×3 IMPLANT
ELECT REM PT RETURN 9FT ADLT (ELECTROSURGICAL) ×3
ELECTRODE REM PT RTRN 9FT ADLT (ELECTROSURGICAL) ×1 IMPLANT
EXTRACTOR VACUUM M CUP 4 TUBE (SUCTIONS) IMPLANT
EXTRACTOR VACUUM M CUP 4' TUBE (SUCTIONS)
GAUZE SPONGE 4X4 12PLY STRL LF (GAUZE/BANDAGES/DRESSINGS) ×6 IMPLANT
GLOVE BIOGEL PI IND STRL 7.0 (GLOVE) ×4 IMPLANT
GLOVE BIOGEL PI INDICATOR 7.0 (GLOVE) ×8
GLOVE ECLIPSE 7.0 STRL STRAW (GLOVE) ×3 IMPLANT
GOWN STRL REUS W/TWL LRG LVL3 (GOWN DISPOSABLE) ×6 IMPLANT
KIT ABG SYR 3ML LUER SLIP (SYRINGE) IMPLANT
NEEDLE HYPO 22GX1.5 SAFETY (NEEDLE) ×3 IMPLANT
NEEDLE HYPO 25X5/8 SAFETYGLIDE (NEEDLE) ×3 IMPLANT
NS IRRIG 1000ML POUR BTL (IV SOLUTION) ×3 IMPLANT
PACK C SECTION WH (CUSTOM PROCEDURE TRAY) ×3 IMPLANT
PAD ABD 7.5X8 STRL (GAUZE/BANDAGES/DRESSINGS) ×3 IMPLANT
PAD OB MATERNITY 4.3X12.25 (PERSONAL CARE ITEMS) ×3 IMPLANT
PENCIL SMOKE EVAC W/HOLSTER (ELECTROSURGICAL) ×3 IMPLANT
RTRCTR C-SECT PINK 25CM LRG (MISCELLANEOUS) IMPLANT
SUT PDS AB 0 CTX 36 PDP370T (SUTURE) IMPLANT
SUT PLAIN 2 0 XLH (SUTURE) IMPLANT
SUT VIC AB 0 CTX 36 (SUTURE) ×4
SUT VIC AB 0 CTX36XBRD ANBCTRL (SUTURE) ×2 IMPLANT
SUT VIC AB 4-0 KS 27 (SUTURE) ×3 IMPLANT
SYR CONTROL 10ML LL (SYRINGE) ×3 IMPLANT
TOWEL OR 17X24 6PK STRL BLUE (TOWEL DISPOSABLE) ×3 IMPLANT
TRAY FOLEY BAG SILVER LF 14FR (SET/KITS/TRAYS/PACK) ×3 IMPLANT

## 2017-03-17 NOTE — Anesthesia Postprocedure Evaluation (Signed)
Anesthesia Post Note  Patient: Misty LoftyAleah Mckenzie  Procedure(s) Performed: Procedure(s) (LRB): CESAREAN SECTION (N/A)     Patient location during evaluation: PACU Anesthesia Type: Spinal Level of consciousness: awake and alert Pain management: pain level controlled Vital Signs Assessment: post-procedure vital signs reviewed and stable Respiratory status: spontaneous breathing and respiratory function stable Cardiovascular status: blood pressure returned to baseline and stable Postop Assessment: spinal receding Anesthetic complications: no    Last Vitals:  Vitals:   03/17/17 1916 03/17/17 1917  BP:    Pulse: 66 64  Resp: 16 16  Temp:      Last Pain:  Vitals:   03/17/17 1900  TempSrc:   PainSc: 3    Pain Goal: Patients Stated Pain Goal: 2 (03/15/17 2008)               Demeshia Sherburne DANIEL

## 2017-03-17 NOTE — Op Note (Signed)
Misty Mckenzie PROCEDURE DATE: 03/17/2017  PREOPERATIVE DIAGNOSES: Intrauterine pregnancy at 7181w6d weeks gestation; non-reassuring fetal status remote from vaginal delivery and severe preeclampsia  POSTOPERATIVE DIAGNOSES: The same  PROCEDURE: Primary Low Transverse Cesarean Section  SURGEON:  Dr. Jaynie CollinsUgonna Anyanwu  ASSISTANT:  Dr. Caryl AdaJazma Phelps  ANESTHESIOLOGY TEAM: Anesthesiologist:  Dr. Shona SimpsonKevin Hollis  CRNA: Graciela HusbandsFussell, Wynn O, CRNA  INDICATIONS: Misty Loftyleah Mckenzie is a 26 y.o. G2P0110 at 3081w6d here for cesarean section secondary to the indications listed under preoperative diagnoses; please see preoperative note for further details.  The risks of cesarean section were discussed with the patient including but were not limited to: bleeding which may require transfusion or reoperation; infection which may require antibiotics; injury to bowel, bladder, ureters or other surrounding organs; injury to the fetus; need for additional procedures including hysterectomy in the event of a life-threatening hemorrhage; placental abnormalities wth subsequent pregnancies, incisional problems, thromboembolic phenomenon and other postoperative/anesthesia complications.   The patient concurred with the proposed plan, giving informed written consent for the procedure.    FINDINGS:  Viable female infant in compound presentation (arm and face).  Apgars 8 and 9.  Weight 3 lbs 10 oz. Clear amniotic fluid.  Intact placenta, three vessel cord.  Normal uterus, fallopian tubes and ovaries bilaterally.  ANESTHESIA: Spinal INTRAVENOUS FLUIDS: 200 ml   ESTIMATED BLOOD LOSS: 500 ml URINE OUTPUT:  200 ml SPECIMENS: Placenta sent to pathology COMPLICATIONS: None immediate  PROCEDURE IN DETAIL:  The patient preoperatively received intravenous antibiotics and had sequential compression devices applied to her lower extremities.  She was then taken to the operating room where spinal anesthesia was administered and was found to be  adequate. She was then placed in a dorsal supine position with a leftward tilt, and prepped and draped in a sterile manner.  A foley catheter was placed into her bladder and attached to constant gravity.  After an adequate timeout was performed, a Pfannenstiel skin incision was made with scalpel and carried through to the underlying layer of fascia. The fascia was incised in the midline, and this incision was extended bilaterally using the Mayo scissors.  Kocher clamps were applied to the superior aspect of the fascial incision and the underlying rectus muscles were dissected off bluntly.  A similar process was carried out on the inferior aspect of the fascial incision. The rectus muscles were separated in the midline bluntly and the peritoneum was entered bluntly. Attention was turned to the lower uterine segment where a low transverse hysterotomy was made with a scalpel and extended bilaterally bluntly.  The infant was successfully delivered, the cord was clamped and cut after one minute, and the infant was handed over to the awaiting neonatology team. Uterine massage was then administered, and the placenta delivered intact with a three-vessel cord. The uterus was then cleared of clots and debris.  The hysterotomy was closed with 0 Vicryl in a running locked fashion, and an imbricating layer was also placed with 0 Vicryl.  The pelvis was cleared of all clot and debris. Hemostasis was confirmed on all surfaces.  The peritoneum was closed with a 0 Vicryl running stitch and the rectus muscles were reapproximated using 0 Vicryl interrupted stitches. The fascia was then closed using 0 Vicryl in a running fashion.  The subcutaneous layer was irrigated, and 30 ml of 0.5% Marcaine was injected subcutaneously around the incision.  The skin was closed with a 4-0 Vicryl subcuticular stitch. The patient tolerated the procedure well. Sponge, lap, instrument and needle counts  were correct x 3.  She was taken to the recovery  room in stable condition.    Jaynie CollinsUGONNA  ANYANWU, MD, FACOG Attending Obstetrician & Gynecologist Faculty Practice, [facility]

## 2017-03-17 NOTE — Progress Notes (Addendum)
[name], female   DOB: 09/02/1990, 26 y.o.   MRN: [id number] Labor Progress Note  S: Patient seen & examined for progress of labor. Patient comfortable. Complaining of blurry vision and anxiety about the cervical checks, and having her baby. No headaches, RUQ/epigastric pain. FHR reassuring, no contractions, no LOF or vaginal bleeding. Good FM.  O: BP (!) 151/105   Pulse (!) 220   Temp 98.1 F (36.7 C) (Oral)   Resp 18   Ht 5\' 5"  (1.651 m)   Wt 60.3 kg (133 lb)   LMP 08/06/2016 (Exact Date)   SpO2 100%   BMI 22.13 kg/m   FHT: 130bpm, mod var, +accels, no decels TOCO: none  CVE: Dilation: Closed Effacement (%): Thick Station: Ballotable Presentation: Vertex Exam by:: Dr. Talbert ForestShirley    Presentation confirmed with bedside US.  A&P: 26 y.o. G2P0010 6679w6d for IOL for severe Pre-E  Cytotec vaginally placed at 1015 Patient started on Mag S/P BMZ x2 (7/15-16) Continue to monitor blood pressures Continue induction protocol Anticipate SVD  [place] Avanna Sowder, DO [facility] Resident PGY-1 03/17/2017 10:18 AM

## 2017-03-17 NOTE — Progress Notes (Signed)
[name], female   DOB: 04/01/1991, [age] y.o.   MRN: [id number] Labor Progress Note  S: Complaining of worsening blurry vision. No headaches, RUQ/epigastric pain. FHR reassuring, no contractions, no LOF or vaginal bleeding. Good FM.  O: BP 126/86   Pulse 77   Temp 97.6 F (36.4 C) (Oral)   Resp 16   Ht 5\' 5"  (1.651 m)   Wt 135 lb (61.2 kg)   LMP 08/06/2016 (Exact Date)   SpO2 99%   BMI 22.47 kg/m   FHT: 120bpm, min var, no accels, no decels TOCO: regular every 5-357min Reflexes: 3+, no clonus  CVE: Dilation: Closed Effacement (%): Thick Station: Ballotable Presentation: Vertex Exam by:: Dr. Talbert ForestShirley    A&P: 26 y.o. G2P0010 3510w6d for IOL for severe Pre-E with persistent Category II FHT tracing, unable to proceed with IOL.  Cesarean delivery recommended.  The risks of cesarean section discussed with the patient included but were not limited to: bleeding which may require transfusion or reoperation; infection which may require antibiotics; injury to bowel, bladder, ureters or other surrounding organs; injury to the fetus; need for additional procedures including hysterectomy in the event of a life-threatening hemorrhage; placental abnormalities wth subsequent pregnancies, incisional problems, thromboembolic phenomenon and other postoperative/anesthesia complications. The patient concurred with the proposed plan, giving informed written consent for the procedure.   Anesthesia and OR aware. Preoperative prophylactic antibiotics and SCDs ordered on call to the OR.  To OR when ready.  Jaynie CollinsUgonna Lygia Olaes, MD 03/17/2017, 3:34 PM

## 2017-03-17 NOTE — Progress Notes (Signed)
[name], female   DOB: 10/14/1990, 26 y.o.   MRN: [id number] Labor Progress Note  S: Patient seen & examined for progress of labor. Patient comfortable. Complaining of worsening blurry vision. No headaches, RUQ/epigastric pain. FHR reassuring, no contractions, no LOF or vaginal bleeding. Good FM.  O: BP (!) 151/104   Pulse 78   Temp 97.9 F (36.6 C) (Oral)   Resp 16   Ht 5\' 5"  (1.651 m)   Wt 135 lb (61.2 kg)   LMP 08/06/2016 (Exact Date)   SpO2 100%   BMI 22.47 kg/m   FHT: 120bpm, min var, +accels(10x10), no decels TOCO: regular every 5-397min Reflexes: 3+, no clonus  CVE: Dilation: Closed Effacement (%): Thick Station: Ballotable Presentation: Vertex Exam by:: Dr. Talbert ForestShirley    Presentation confirmed with bedside US.  A&P: 26 y.o. G2P0010 8542w6d for IOL for severe Pre-E  Labor: Cytotec vaginally placed at 1015; repeat for IOL; place foley bulb when able Pre-E: BPs remain elevated in severe range, hydralazine protocol initiated, continue Mag and Labetalol PO 800 TID Preterm: S/P BMZ x2 (7/15-16) Pain: anticipate epidural   Anticipate SVD  Caryl AdaJazma Cystal Shannahan, DO OB Fellow 03/17/2017, 1:18 PM

## 2017-03-17 NOTE — Anesthesia Procedure Notes (Signed)
Spinal  Patient location during procedure: OR Start time: 03/17/2017 3:50 PM End time: 03/17/2017 3:53 PM Staffing Anesthesiologist: Suella Broad D Performed: anesthesiologist  Preanesthetic Checklist Completed: patient identified, site marked, surgical consent, pre-op evaluation, timeout performed, IV checked, risks and benefits discussed and monitors and equipment checked Spinal Block Patient position: sitting Prep: Betadine Patient monitoring: heart rate, continuous pulse ox, blood pressure and cardiac monitor Approach: midline Location: L4-5 Injection technique: single-shot Needle Needle type: Introducer and Pencan  Needle gauge: 24 G Needle length: 9 cm Additional Notes Negative paresthesia. Negative blood return. Positive free-flowing CSF. Expiration date of kit checked and confirmed. Patient tolerated procedure well, without complications.

## 2017-03-17 NOTE — Progress Notes (Signed)
Faculty Practice OB/GYN Attending Note   Subjective:  Called to evaluate patient with increased incisional bleeding s/p cesarean section about 3 hours ago.  Patient is comfortable, improved vision, no headaches. No other concerning symptoms.    Admitted on 03/07/2017 for Preeclampsia, severe, third trimester.    Objective:  Blood pressure (!) 143/102, pulse 68, temperature (!) 97.5 F (36.4 C), resp. rate (!) 27, height 5\' 5"  (1.651 m), weight 135 lb (61.2 kg), last menstrual period 08/06/2016, SpO2 96 %, unknown if currently breastfeeding. Vitals:   03/17/17 1947 03/17/17 1948 03/17/17 1949 03/17/17 1950  BP:      Pulse: 70 71 68 68  Resp: (!) 21 19 19  (!) 27  Temp:      TempSrc:      SpO2: 96% 97% 98% 96%  Weight:      Height:        Gen: NAD HENT: Normocephalic, atraumatic Lungs: Normal respiratory effort Heart: Regular rate noted Abdomen: NT, firm fundus, soft, dressing noted to be 3/4 saturated with blood.  Dressing removed, no active bleeding noted even after fundal pressure.  New honeycomb and pressure dressing placed. Pelvic: Moderate lochia noted on pad Ext: 2+ DTRs, no edema, no cyanosis, negative Homan's sign  Assessment & Plan:  26 y.o. [id number] s/p cesarean section with some bleeding from incision. Dressing changed, benign abdomen Will check labs: CBC, DIC panel, CMET If bleeding worsens, may need re-operation. Will keep NPO for now. Continue close observation in PACU for now.    Misty CollinsUGONNA  Micah Barnier, MD, FACOG Attending Obstetrician & Gynecologist Faculty Practice, [facility]

## 2017-03-17 NOTE — Progress Notes (Signed)
[name], female   DOB: 12/04/1990, 26 y.o.   MRN: [id number] Labor Progress Note  S: Patient seen & examined for progress of labor. Patient comfortable. Still very anxious about going into labor and her baby going to NICU. Patient complaining that her blurry vision is worsening. Denies HA, RUQ pain.  O: BP (!) 151/104   Pulse 78   Temp 97.9 F (36.6 C) (Oral)   Resp 16   Ht 5\' 5"  (1.651 m)   Wt 61.2 kg (135 lb)   LMP 08/06/2016 (Exact Date)   SpO2 100%   BMI 22.47 kg/m   FHT: 120 bpm, mod var, +accels, no decels TOCO: irregular , patient looks comfortable during contractions  CVE: Dilation: Closed Effacement (%): Thick Station: Ballotable Presentation: Vertex Exam by:: Dr. Talbert ForestShirley   A&P: 26 y.o. G2P0010 8293w6d IOL for Pre-e with severe features  S/p cytotec x1 Patient has had a few BP's >160/>100- given labetalol and hydralazine and continue to monitor closely Continue current management Anticipate SVD  [place]iland[place] Hanya Guerin, DO FM Resident PGY-1 03/17/2017 1:09 PM

## 2017-03-17 NOTE — Transfer of Care (Signed)
Immediate Anesthesia Transfer of Care Note  Patient: Misty Mckenzie  Procedure(s) Performed: Procedure(s): CESAREAN SECTION (N/A)  Patient Location: PACU  Anesthesia Type:Spinal  Level of Consciousness: awake, alert  and oriented  Airway & Oxygen Therapy: Patient Spontanous Breathing  Post-op Assessment: Report given to RN and Post -op Vital signs reviewed and stable  Post vital signs: Reviewed  Last Vitals:  Vitals:   03/17/17 1530 03/17/17 1715  BP: 138/90 107/66  Pulse: 81 68  Resp: 16 (!) 22  Temp:      Last Pain:  Vitals:   03/17/17 1715  TempSrc:   PainSc: 0-No pain     Patient's recorded axillary temperature in PACU 93.6. Scientist, research (physical sciences)Bair Hugger gown obtained for L&D Product/process development scientistecovery RN. The patient states that she feels warm. Patients Stated Pain Goal: 2 (03/15/17 2008)  Complications: No apparent anesthesia complications

## 2017-03-17 NOTE — Consult Note (Signed)
Neonatology Note:   Attendance at C-section:    I was asked by Dr. Anyanwu to attend this primary C/S at 31 6/7 weeks due to fetal intolerance of labor. The mother is a G2P0A1 B pos, GBS pos with severe pre-eclampsia, on magnesium sulfate and labetalol. IOL due to elevated BP today, with NRFHR tracing noted. Mother got Betamethasone 7/15-16. ROM at delivery, fluid clear. Infant vigorous with good spontaneous cry and tone. Delayed cord clamping was done. Needed only minimal bulb suctioning. Ap 8/9. Lungs clear to ausc in DR, noted to have low resting HR of about 105 throughout. O2 saturation 90-94% in room air by 5 minutes. Held by parents briefly in the OR, then transported to the NICU for further care, with her father in attendance.   Misty Mckenzie C. Zamara Cozad, MD 

## 2017-03-17 NOTE — Op Note (Deleted)
Duplicate note please disregard

## 2017-03-17 NOTE — Anesthesia Preprocedure Evaluation (Signed)
Anesthesia Evaluation  Patient identified by MRN, date of birth, ID band Patient awake    Reviewed: Allergy & Precautions, NPO status , Patient's Chart, lab work & pertinent test results  Airway Mallampati: I  TM Distance: >3 FB Neck ROM: Full    Dental  (+) Teeth Intact, Dental Advisory Given   Pulmonary neg pulmonary ROS,    breath sounds clear to auscultation       Cardiovascular hypertension,  Rhythm:Regular Rate:Normal     Neuro/Psych negative neurological ROS  negative psych ROS   GI/Hepatic negative GI ROS, Neg liver ROS,   Endo/Other  negative endocrine ROS  Renal/GU negative Renal ROS     Musculoskeletal negative musculoskeletal ROS (+)   Abdominal   Peds  Hematology negative hematology ROS (+)   Anesthesia Other Findings Day of surgery medications reviewed with the patient.  Reproductive/Obstetrics (+) Pregnancy                             Lab Results  Component Value Date   WBC 5.8 03/17/2017   HGB 11.3 (L) 03/17/2017   HCT 33.2 (L) 03/17/2017   MCV 88.1 03/17/2017   PLT 164 03/17/2017     Anesthesia Physical Anesthesia Plan  ASA: II and emergent  Anesthesia Plan: Spinal   Post-op Pain Management:    Induction:   PONV Risk Score and Plan:   Airway Management Planned:   Additional Equipment:   Intra-op Plan:   Post-operative Plan:   Informed Consent: I have reviewed the patients History and Physical, chart, labs and discussed the procedure including the risks, benefits and alternatives for the proposed anesthesia with the patient or authorized representative who has indicated his/her understanding and acceptance.     Plan Discussed with: CRNA  Anesthesia Plan Comments:         Anesthesia Quick Evaluation

## 2017-03-17 NOTE — Progress Notes (Signed)
Faculty Practice OB/GYN Attending Note   Subjective:  Called to evaluate patient with severe range BP after  morning dose of Labetalol 800 mg po.  Patient reports blurry vision, no scotomata. No headaches, RUQ/epigastric pain. FHR reassuring, no contractions, no LOF or vaginal bleeding. Good FM.   Admitted on 03/07/2017 for Preeclampsia, severe, third trimester.    Objective:  Blood pressure (!) 175/112, pulse 79, temperature (!) 97.5 F (36.4 C), temperature source Oral, resp. rate 18, height 5\' 5"  (1.651 m), weight 133 lb (60.3 kg), last menstrual period 08/06/2016, SpO2 100 %, unknown if currently breastfeeding.  Patient Vitals for the past 24 hrs:  BP Temp Temp src Pulse Resp SpO2  03/17/17 0844 (!) 175/112 - - - - -  03/17/17 0817 (!) 160/108 (!) 97.5 F (36.4 C) Oral 79 18 100 %  03/17/17 0350 (!) 158/95 98.2 F (36.8 C) Oral 74 18 99 %  03/17/17 0012 - 98.8 F (37.1 C) Oral 76 17 98 %  03/16/17 2031 (!) 151/99 98.3 F (36.8 C) Oral 83 17 99 %  03/16/17 1550 (!) 149/87 98.4 F (36.9 C) Oral 77 16 98 %  03/16/17 1201 (!) 155/98 98.5 F (36.9 C) Oral 82 16 99 %    FHT  Baseline 125bpm, moderate variability, +accelerations, no decelerations Toco: no contractions Gen: NAD HENT: Normocephalic, atraumatic Lungs: Normal respiratory effort Heart: Regular rate noted Abdomen: NT gravid fundus, soft Cervix: Deferred Ext: 2+ DTRs, no edema, no cyanosis, negative Homan's sign  Assessment & Plan:  26 y.o. G2P0010 at 3245w6d admitted for severe preeclampsia now with severe range BP despite maximum Labetalol dosage.  Will proceed with IOL. - Transfer to L&D - Neonatalogy notified - Will start magnesium sulfate for eclampsia prophylaxis - Will check labs,  continue close observation.  Jaynie CollinsUGONNA  Decarlos Empey, MD, FACOG Attending Obstetrician & Gynecologist Faculty Practice, [facility]

## 2017-03-18 ENCOUNTER — Encounter (HOSPITAL_COMMUNITY): Payer: Self-pay | Admitting: *Deleted

## 2017-03-18 LAB — COMPREHENSIVE METABOLIC PANEL
ALK PHOS: 222 U/L — AB (ref 38–126)
ALT: 20 U/L (ref 14–54)
ANION GAP: 4 — AB (ref 5–15)
AST: 38 U/L (ref 15–41)
Albumin: 1.5 g/dL — ABNORMAL LOW (ref 3.5–5.0)
BILIRUBIN TOTAL: 0.4 mg/dL (ref 0.3–1.2)
BUN: 13 mg/dL (ref 6–20)
CALCIUM: 7.1 mg/dL — AB (ref 8.9–10.3)
CO2: 21 mmol/L — ABNORMAL LOW (ref 22–32)
Chloride: 108 mmol/L (ref 101–111)
Creatinine, Ser: 0.87 mg/dL (ref 0.44–1.00)
Glucose, Bld: 75 mg/dL (ref 65–99)
Potassium: 5.1 mmol/L (ref 3.5–5.1)
Sodium: 133 mmol/L — ABNORMAL LOW (ref 135–145)
TOTAL PROTEIN: 4.3 g/dL — AB (ref 6.5–8.1)

## 2017-03-18 LAB — CBC
HCT: 30.1 % — ABNORMAL LOW (ref 36.0–46.0)
HEMOGLOBIN: 10.5 g/dL — AB (ref 12.0–15.0)
MCH: 30.7 pg (ref 26.0–34.0)
MCHC: 34.9 g/dL (ref 30.0–36.0)
MCV: 88 fL (ref 78.0–100.0)
Platelets: 159 10*3/uL (ref 150–400)
RBC: 3.42 MIL/uL — ABNORMAL LOW (ref 3.87–5.11)
RDW: 14.3 % (ref 11.5–15.5)
WBC: 8.4 10*3/uL (ref 4.0–10.5)

## 2017-03-18 LAB — RPR: RPR: NONREACTIVE

## 2017-03-18 MED ORDER — LABETALOL HCL 200 MG PO TABS
400.0000 mg | ORAL_TABLET | Freq: Three times a day (TID) | ORAL | Status: DC
  Administered 2017-03-18 (×3): 400 mg via ORAL
  Filled 2017-03-18 (×7): qty 2

## 2017-03-18 MED ORDER — FUROSEMIDE 10 MG/ML IJ SOLN
20.0000 mg | Freq: Once | INTRAMUSCULAR | Status: AC
Start: 2017-03-18 — End: 2017-03-18
  Administered 2017-03-18: 20 mg via INTRAVENOUS
  Filled 2017-03-18: qty 2

## 2017-03-18 NOTE — Lactation Note (Signed)
This note was copied from a baby's chart. Lactation Consultation Note  Patient Name: Misty Loel Loftyleah Mckenzie JXBJY'NToday's Date: 03/18/2017 Reason for consult: Initial assessment Baby at 18 hr of life. Mom is sleepy and stated she is having a hard time seeing. She would like to provided expressed milk for her NICU baby. She was agreeable to pumping at this visit. Mom has small wide spaced breast with quarter sized areolas and long firm nipples. She asked for help removing her barbell shaped nipple rings. Instructed to remove the jewelry before pumping or latching baby. A couple of large drops of colostrum were noted with manual expression. Given small snap container to collect the milk and take to the NICU. Instructed mom to get labels from the NICU for her milk. Given lactation and NICU handouts. Aware of OP services and support group. She will pump 8+/24hr and handle milk according to hospital guidelines.    Maternal Data Has patient been taught Hand Expression?: Yes  Feeding    LATCH Score/Interventions                      Lactation Tools Discussed/Used Pump Review: Setup, frequency, and cleaning;Milk Storage;Other (comment) (pump settings) Initiated by:: ES Date initiated:: 03/18/17   Consult Status Consult Status: Follow-up Date: 03/19/17 Follow-up type: In-patient    Rulon Eisenmengerlizabeth E Arihana Ambrocio 03/18/2017, 10:14 AM

## 2017-03-18 NOTE — Addendum Note (Signed)
Addendum  created 03/18/17 0814 by Yolonda Kidaarver, Khayla Koppenhaver L, CRNA   Sign clinical note

## 2017-03-18 NOTE — Anesthesia Postprocedure Evaluation (Signed)
Anesthesia Post Note  Patient: Misty Mckenzie  Procedure(s) Performed: Procedure(s) (LRB): CESAREAN SECTION (N/A)     Patient location during evaluation: [facility] Anesthesia Type: Spinal Level of consciousness: awake, awake and alert, oriented and patient cooperative Pain management: pain level controlled Vital Signs Assessment: post-procedure vital signs reviewed and stable Respiratory status: spontaneous breathing, nonlabored ventilation and respiratory function stable Cardiovascular status: stable Postop Assessment: no headache, no backache, patient able to bend at knees and no signs of nausea or vomiting Anesthetic complications: no    Last Vitals:  Vitals:   03/18/17 0629 03/18/17 0700  BP:  106/74  Pulse:  61  Resp:  16  Temp: (!) 34.5 C (!) 36.2 C    Last Pain:  Vitals:   03/18/17 0700  TempSrc: Oral  PainSc: 0-No pain   Pain Goal: Patients Stated Pain Goal: 2 (03/15/17 2008)               Miliano Cotten L

## 2017-03-18 NOTE — Progress Notes (Addendum)
Dr. Despina HiddenEure notified of pt oral temp 93.2. Pt's skin is warm and dry. Pt vitals WNL and is asymptomatic. Pt reports feeling drowsy, but has no pain. Told to cover pt up in blankets and monitor temperature.

## 2017-03-19 MED ORDER — AMLODIPINE BESYLATE 10 MG PO TABS
10.0000 mg | ORAL_TABLET | Freq: Every day | ORAL | Status: DC
Start: 2017-03-19 — End: 2017-03-20
  Administered 2017-03-19 – 2017-03-20 (×2): 10 mg via ORAL
  Filled 2017-03-19 (×2): qty 1

## 2017-03-19 NOTE — Progress Notes (Signed)
Subjective: Postpartum Day 1: Cesarean Delivery at 3135w6d for NRFHT in the setting of severe preeclampsia  Patient reports incisional pain and tolerating PO.  No flatus. Denies headache, RUQ pain. Improved visual symptoms. Baby doing well in NICU  Objective:  BP Temp Temp src Pulse Resp SpO2  03/18/17 1000 - 98.3 F (36.8 C) Oral - - 95 %  03/18/17 0900 116/78 - Oral 78 - 96 %    Physical Exam:  General: alert and no distress Lochia: appropriate Uterine Fundus: firm Incision: no significant drainage DVT Evaluation: No evidence of DVT seen on physical exam. Negative Homan's sign. No cords or calf tenderness.   Recent Labs  03/17/17 2021 03/18/17 0453  HGB 11.9* 10.5*  HCT 35.0* 30.1*   CBC Latest Ref Rng & Units 03/18/2017 03/17/2017 03/17/2017  WBC 4.0 - 10.5 K/uL 8.4 9.7 -  Hemoglobin 12.0 - 15.0 g/dL 10.5(L) 11.9(L) -  Hematocrit 36.0 - 46.0 % 30.1(L) 35.0(L) -  Platelets 150 - 400 K/uL 159 163 164    CMP Latest Ref Rng & Units 03/18/2017 03/17/2017 03/17/2017  Glucose 65 - 99 mg/dL 75 72 81  BUN 6 - 20 mg/dL 13 12 13   Creatinine 0.44 - 1.00 mg/dL 1.610.87 0.960.91 0.450.86  Sodium 135 - 145 mmol/L 133(L) 137 136  Potassium 3.5 - 5.1 mmol/L 5.1 5.0 4.4  Chloride 101 - 111 mmol/L 108 111 111  CO2 22 - 32 mmol/L 21(L) 21(L) 19(L)  Calcium 8.9 - 10.3 mg/dL 7.1(L) 7.6(L) 7.8(L)  Total Protein 6.5 - 8.1 g/dL 4.3(L) 5.0(L) 4.2(L)  Total Bilirubin 0.3 - 1.2 mg/dL 0.4 0.3 0.5  Alkaline Phos 38 - 126 U/L 222(H) 278(H) 290(H)  AST 15 - 41 U/L 38 41 44(H)  ALT 14 - 54 U/L 20 26 26    Assessment/Plan: Status post Cesarean section. Doing well postoperatively.  Continue magnesium sulfate for 24 hours postpartum Continue Labetalol for BP control, dose reduced to 400mg  tid Breastfeeding, desires OCPs for contraception Routine postpartum care  Jaynie CollinsUgonna Anyanwu, MD

## 2017-03-19 NOTE — Progress Notes (Signed)
CSW attempted to meet with MOB to offer support and complete assessment due to NICU admission at 31.6 weeks, but she was not in her room at this time.  CSW will attempt again at a later time.  

## 2017-03-19 NOTE — Progress Notes (Signed)
Subjective: Postpartum Day 2: Cesarean Delivery at 375w6d for NRFHT in the setting of severe preeclampsia  Patient reports incisional pain and tolerating PO.  Had flatus. Denies headache, RUQ pain. Improved visual symptoms. Baby doing well in NICU  Objective: Patient Vitals for the past 24 hrs:  BP Temp Temp src Pulse Resp SpO2  03/19/17 0800 (!) 132/92 98.9 F (37.2 C) Oral 68 18 100 %  03/19/17 0500 (!) 157/94 98.9 F (37.2 C) Oral 70 18 99 %  03/19/17 0000 (!) 145/94 98.3 F (36.8 C) Oral 60 18 96 %  03/18/17 2159 - - - - 18 -  03/18/17 2044 - - - - 16 -  03/18/17 2026 - - - - 18 -  03/18/17 2020 (!) 131/91 97.8 F (36.6 C) Oral (!) 56 20 100 %  03/18/17 1940 - - - - 18 -  03/18/17 1555 124/87 97.8 F (36.6 C) Oral (!) 58 18 98 %  03/18/17 1415 - - - - 16 -  03/18/17 1200 116/76 98.4 F (36.9 C) Oral 75 18 96 %  03/18/17 1058 - 98.6 F (37 C) Oral - - 96 %  03/18/17 1000 - 98.3 F (36.8 C) Oral - - 95 %  03/18/17 0900 116/78 - Oral 78 - 96 %   Physical Exam:  General: alert and no distress Lochia: appropriate Uterine Fundus: firm, soft Incision: no significant drainage DVT Evaluation: No evidence of DVT seen on physical exam. Negative Homan's sign. No cords or calf tenderness.   Recent Labs  03/17/17 2021 03/18/17 0453  HGB 11.9* 10.5*  HCT 35.0* 30.1*   CBC Latest Ref Rng & Units 03/18/2017 03/17/2017 03/17/2017  WBC 4.0 - 10.5 K/uL 8.4 9.7 -  Hemoglobin 12.0 - 15.0 g/dL 10.5(L) 11.9(L) -  Hematocrit 36.0 - 46.0 % 30.1(L) 35.0(L) -  Platelets 150 - 400 K/uL 159 163 164    CMP Latest Ref Rng & Units 03/18/2017 03/17/2017 03/17/2017  Glucose 65 - 99 mg/dL 75 72 81  BUN 6 - 20 mg/dL 13 12 13   Creatinine 0.44 - 1.00 mg/dL 1.610.87 0.960.91 0.450.86  Sodium 135 - 145 mmol/L 133(L) 137 136  Potassium 3.5 - 5.1 mmol/L 5.1 5.0 4.4  Chloride 101 - 111 mmol/L 108 111 111  CO2 22 - 32 mmol/L 21(L) 21(L) 19(L)  Calcium 8.9 - 10.3 mg/dL 7.1(L) 7.6(L) 7.8(L)  Total Protein 6.5 -  8.1 g/dL 4.3(L) 5.0(L) 4.2(L)  Total Bilirubin 0.3 - 1.2 mg/dL 0.4 0.3 0.5  Alkaline Phos 38 - 126 U/L 222(H) 278(H) 290(H)  AST 15 - 41 U/L 38 41 44(H)  ALT 14 - 54 U/L 20 26 26    Assessment/Plan: Status post Cesarean section. Doing well postoperatively.  Switched to Norvasc 10 mg daily for BP control, monitor BP today Breastfeeding, desires OCPs for contraception Routine postpartum care Discharge in the morning if remains stable  Jaynie CollinsUGONNA  ANYANWU, MD, FACOG Attending Obstetrician & Gynecologist, Faculty Practice Center for [facility], [facility]

## 2017-03-19 NOTE — Lactation Note (Addendum)
This note was copied from a baby's chart. Lactation Consultation Note  Patient Name: Misty Mckenzie [id number]'XToday's Date: 03/19/2017 Reason for consult: Follow-up assessment   With this mom of a NICU baby, now 4641 hours old and 32 1/7 weeks CGA, weight 3 lbs 6.7 oz. Mom has been pumping some, no milk yet. I explained how pumping is important, even her milk has not transitioned in yet. I reviewed hand expression with mom, and was able to express a small drop of colostrum from her left breast. Mom was encouraged to pump at least 8 times a day, followed with hand expression.I gave mom 21 flanges and coconut oil to apply to her nipples prior to pumping. I advised mom to go back to 24 flanges if the 21's are uncomfortable.  Mom is active with WIC, and will loan a DEP prior to discharge to home tomorrow.    Maternal Data    Feeding    LATCH Score/Interventions                      Lactation Tools Discussed/Used WIC Program: Yes ([place] - Fax sent for DEP)   Consult Status Consult Status: Follow-up Date: 03/20/17 Follow-up type: In-patient    [name], [name] 03/19/2017, 9:38 AM

## 2017-03-20 MED ORDER — OXYCODONE-ACETAMINOPHEN 5-325 MG PO TABS: 1.0000 | ORAL_TABLET | ORAL | 0 refills | Status: AC | PRN

## 2017-03-20 MED ORDER — LABETALOL HCL 200 MG PO TABS
400.0000 mg | ORAL_TABLET | Freq: Two times a day (BID) | ORAL | Status: DC
  Administered 2017-03-20: 400 mg via ORAL
  Filled 2017-03-20: qty 2

## 2017-03-20 MED ORDER — AMLODIPINE BESYLATE 10 MG PO TABS: 10.0000 mg | ORAL_TABLET | Freq: Every day | ORAL | 1 refills | Status: AC

## 2017-03-20 MED ORDER — HYDROCHLOROTHIAZIDE 25 MG PO TABS: 25.0000 mg | ORAL_TABLET | Freq: Every day | ORAL | 0 refills | Status: DC

## 2017-03-20 MED ORDER — LABETALOL HCL 200 MG PO TABS: 400.0000 mg | ORAL_TABLET | Freq: Two times a day (BID) | ORAL | 0 refills | Status: DC

## 2017-03-20 MED ORDER — HYDROCHLOROTHIAZIDE 25 MG PO TABS
25.0000 mg | ORAL_TABLET | Freq: Every day | ORAL | Status: DC
  Administered 2017-03-20: 25 mg via ORAL
  Filled 2017-03-20: qty 1

## 2017-03-20 MED ORDER — IBUPROFEN 600 MG PO TABS: 600.0000 mg | ORAL_TABLET | Freq: Four times a day (QID) | ORAL | 0 refills | Status: AC

## 2017-03-20 NOTE — Discharge Summary (Signed)
Physician Discharge Summary  [name] Cumby MRN: [id number] DOB/AGE: 25/04/1991 25 y.o.  Admit date: 03/07/2017 Discharge date: 03/20/2017  Admission Diagnoses:Pregnancy [redacted] weeks gestation, preeclampsia  Discharge Diagnoses:  Principal Problem:   Preeclampsia, severe, third trimester Active Problems:   S/P cesarean section   Discharged Condition: good  Hospital Course: Misty Mckenzie Potier is a 26 y.o. female G2P0010 @30 .3 weeks presenting for Preeclampsia. She presented to [facility] with epigastric tightening today that prompted her to check her BP and was found to be 160s/100s. Evaluation found severe range BPs and PCR of 2.75, other labs were nml. Her BPs were treated with IV Labetalol. Her pregnancy has been uncomplicated outside of a GBS carrier.  She received magnesium sulfate for 24 hours and betamethasone 12 mg IM every 24 hours 2 Blood pressures remained moderately elevated and gradually increased, eventually reaching severe levels again on 03/11/2018. Maternal fetal medicine was consulted and plans arranged to aggressively add labetalol repeat magnesium sulfate for additional 24 hours. Liver function tests were gradually increasing but stabilized. With AST upper limits normal, ALT remaining within normal limits and platelets remaining in the 140-160,000 range  On 03/17/2017 she developed headache and blurry vision sufficiently towards severe preeclampsia criteria while on maximum doses of labetalol 800 mg 3 times a day suture was transferred to labor and delivery for induction of labor , Using Cytotec initially. Fetal heart rate became nonreassuring, category 2 cervix still closed so induction was not possible , she was taken for primary cesarean section low transverse incision  Consults: Maternal fetal medicine  Significant Diagnostic Studies: labs:  CBC Latest Ref Rng & Units 03/18/2017 03/17/2017 03/17/2017  WBC 4.0 - 10.5 K/uL 8.4 9.7 -  Hemoglobin 12.0 - 15.0 g/dL 10.5(L)  11.9(L) -  Hematocrit 36.0 - 46.0 % 30.1(L) 35.0(L) -  Platelets 150 - 400 K/uL 159 163 164   CMP Latest Ref Rng & Units 03/18/2017 03/17/2017 03/17/2017  Glucose 65 - 99 mg/dL 75 72 81  BUN 6 - 20 mg/dL 13 12 13   Creatinine 0.44 - 1.00 mg/dL 4.690.87 6.290.91 5.280.86  Sodium 135 - 145 mmol/L 133(L) 137 136  Potassium 3.5 - 5.1 mmol/L 5.1 5.0 4.4  Chloride 101 - 111 mmol/L 108 111 111  CO2 22 - 32 mmol/L 21(L) 21(L) 19(L)  Calcium 8.9 - 10.3 mg/dL 7.1(L) 7.6(L) 7.8(L)  Total Protein 6.5 - 8.1 g/dL 4.3(L) 5.0(L) 4.2(L)  Total Bilirubin 0.3 - 1.2 mg/dL 0.4 0.3 0.5  Alkaline Phos 38 - 126 U/L 222(H) 278(H) 290(H)  AST 15 - 41 U/L 38 41 44(H)  ALT 14 - 54 U/L 20 26 26       Treatments 03/17/2017: surgery: Primary low transverse cervical cesarean section Postpartum course was notable for magnesium sulfate for 24 hours postpartum, continued elevated pressures after magnesium was discontinued and so she was placed on Norvasc 10 mg daily and labetalol 400 mg 3 times a day 30 days. She was given one week  HCTZ 25 mg Discharge Exam: Blood pressure (!) 159/102, pulse 77, temperature 98.9 F (37.2 C), temperature source Oral, resp. rate 18, height 5\' 5"  (1.651 m), weight 135 lb (61.2 kg), last menstrual period 08/06/2016, SpO2 99 %, unknown if currently breastfeeding. General appearance: alert, cooperative and no distress Head: Normocephalic, without obvious abnormality, atraumatic Eyes: conjunctivae/corneas clear. PERRL, EOM's intact. Fundi benign., recovered from vision changes Resp: clear to auscultation bilaterally Breasts: normal appearance, no masses or tenderness, pumping GI: soft, non-tender; bowel sounds normal; no masses,  no organomegaly  Extremities: extremities normal, atraumatic, no cyanosis or edema, Homans sign is negative, no sign of DVT and reflexes 2+ , pedal edema persists, has been present x 2 wks. Incision/Wound:  Disposition: 01-Home or Self Care  Discharge Instructions    Call MD  for:    Complete by:  As directed    Headaches that are severe , upper abdominal pain, or any vision changes for the worse.   Call MD for:  difficulty breathing, headache or visual disturbances    Complete by:  As directed    Call MD for:  persistant nausea and vomiting    Complete by:  As directed    Call MD for:  temperature >100.4    Complete by:  As directed    Diet - low sodium heart healthy    Complete by:  As directed    Increase activity slowly    Complete by:  As directed      Allergies as of 03/20/2017   No Known Allergies     Medication List    TAKE these medications   amLODipine 10 MG tablet Commonly known as:  NORVASC Take 1 tablet (10 mg total) by mouth daily.   CITRANATAL BLOOM 90-1 MG Tabs Take 1 tablet by mouth daily.   hydrochlorothiazide 25 MG tablet Commonly known as:  HYDRODIURIL Take 1 tablet (25 mg total) by mouth daily.   ibuprofen 600 MG tablet Commonly known as:  ADVIL,MOTRIN Take 1 tablet (600 mg total) by mouth every 6 (six) hours.   labetalol 200 MG tablet Commonly known as:  NORMODYNE Take 2 tablets (400 mg total) by mouth 2 (two) times daily.   oxyCODONE-acetaminophen 5-325 MG tablet Commonly known as:  PERCOCET/ROXICET Take 1 tablet by mouth every 4 (four) hours as needed (pain scale 4-7).      Follow-up Information    [facility] Follow up in 1 week(s).   Specialty:  Obstetrics and Gynecology Why:  for BP check Contact information: [address], [address] [place] [id number] [phone]        Follow-up arrangements for her baby love home care blood pressure check this week. Patient checked her blood pressures and will report to her clinic for any pressures  above 160  Signed: Talley Kreiser V 03/20/2017, 8:45 AM

## 2017-03-20 NOTE — Lactation Note (Signed)
This note was copied from a baby's chart. Lactation Consultation Note  Patient Name: Misty Mckenzie LVDIX'V Date: 03/20/2017 Reason for consult: Follow-up assessment   With this mom of a NICU baby, now 51 hours old and 32 2/7 weeks CGA, in NICU. Mom is being discharged to home today, and I loaned her a [facility] DEP, instructed her in it's use. I also gave her a manual hand pump and instructed her in it's use. Mom states the 21 flanges were working well, and she has begun to have her milk transition in. Mom advised to bring her pump kit with her when she visits the baby in the NICU, and to continue with skin to skin and nuzzling. Mom knows to have her baby's nurse cal for lactation as needed.    Maternal Data    Feeding Feeding Type: Donor Breast Milk Length of feed: 30 min  LATCH Score/Interventions                      Lactation Tools Discussed/Used     Consult Status Consult Status: PRN Follow-up type: In-patient (NICU)    Misty Mckenzie 03/20/2017, 9:14 AM

## 2017-03-20 NOTE — Progress Notes (Signed)
Subjective: Postpartum Day 3: Cesarean Delivery for NRFHT in severe preeclampsia remote from term Patient reports tolerating PO and no problems voiding.  Pt vision has recovered. BP'S ARE ELEVATED DURING CONVERSION TO NORVASC, SO LABETALOL RESTARTED IN ADDITION. WILL RECHECK IN 2 HRS AND CONSIDER D/C ON 2 MEDS Objective: Vital signs in last 24 hours:BP (!) 168/102 (BP Location: Left Arm)   Pulse 76   Temp 98.8 F (37.1 C) (Oral)   Resp 16   Ht 5\' 5"  (1.651 m)   Wt 135 lb (61.2 kg)   LMP 08/06/2016 (Exact Date)   SpO2 100%   Breastfeeding? Unknown   BMI 22.47 kg/m   Temp:  [98.6 F (37 C)-99.6 F (37.6 C)] 98.8 F (37.1 C) (07/28 0410) Pulse Rate:  [65-93] 76 (07/28 0605) Resp:  [16-18] 16 (07/28 0627) BP: (132-168)/(83-102) 168/102 (07/28 0605) SpO2:  [99 %-100 %] 100 % (07/28 0410)  Physical Exam:  General: alert, cooperative and no distress Lochia: appropriate Uterine Fundus: firm Incision: healing well, no significant drainage, no dehiscence, no significant erythema DVT Evaluation: No evidence of DVT seen on physical exam.   Recent Labs  03/17/17 2021 03/18/17 0453  HGB 11.9* 10.5*  HCT 35.0* 30.1*    Assessment/Plan: Status post Cesarean section. Postoperative course complicated by RECURRENT HTN S/P MAG, CONVERTED TO NORVASC 10  WILL REASSESS 9 AM AND PROBABLE D/C ON 2 MEDS WITH BABY LOVE RECHECK.  Quincy Prisco V 03/20/2017, 7:44 AM

## 2017-03-20 NOTE — Discharge Instructions (Signed)
Hypertension During Pregnancy °Hypertension, commonly called high blood pressure, is when the force of blood pumping through your arteries is too strong. Arteries are blood vessels that carry blood from the heart throughout the body. Hypertension during pregnancy can cause problems for you and your baby. Your baby may be born early (prematurely) or may not weigh as much as he or she should at birth. Very bad cases of hypertension during pregnancy can be life-threatening. °Different types of hypertension can occur during pregnancy. These include: °· Chronic hypertension. This happens when: °? You have hypertension before pregnancy and it continues during pregnancy. °? You develop hypertension before you are [redacted] weeks pregnant, and it continues during pregnancy. °· Gestational hypertension. This is hypertension that develops after the 20th week of pregnancy. °· Preeclampsia, also called toxemia of pregnancy. This is a very serious type of hypertension that develops only during pregnancy. It affects the whole body, and it can be very dangerous for you and your baby. ° °Gestational hypertension and preeclampsia usually go away within 6 weeks after your baby is born. Women who have hypertension during pregnancy have a greater chance of developing hypertension later in life or during future pregnancies. °What are the causes? °The exact cause of hypertension is not known. °What increases the risk? °There are certain factors that make it more likely for you to develop hypertension during pregnancy. These include: °· Having hypertension during a previous pregnancy or prior to pregnancy. °· Being overweight. °· Being older than age 40. °· Being pregnant for the first time or being pregnant with more than one baby. °· Becoming pregnant using fertilization methods such as IVF (in vitro fertilization). °· Having diabetes, kidney problems, or systemic lupus erythematosus. °· Having a family history of hypertension. ° °What are the  signs or symptoms? °Chronic hypertension and gestational hypertension rarely cause symptoms. Preeclampsia causes symptoms, which may include: °· Increased protein in your urine. Your health care provider will check for this at every visit before you give birth (prenatal visit). °· Severe headaches. °· Sudden weight gain. °· Swelling of the hands, face, legs, and feet. °· Nausea and vomiting. °· Vision problems, such as blurred or double vision. °· Numbness in the face, arms, legs, and feet. °· Dizziness. °· Slurred speech. °· Sensitivity to bright lights. °· Abdominal pain. °· Convulsions. ° °How is this diagnosed? °You may be diagnosed with hypertension during a routine prenatal exam. At each prenatal visit, you may: °· Have a urine test to check for high amounts of protein in your urine. °· Have your blood pressure checked. A blood pressure reading is recorded as two numbers, such as "120 over 80" (or 120/80). The first ("top") number is called the systolic pressure. It is a measure of the pressure in your arteries when your heart beats. The second ("bottom") number is called the diastolic pressure. It is a measure of the pressure in your arteries as your heart relaxes between beats. Blood pressure is measured in a unit called mm Hg. A normal blood pressure reading is: °? Systolic: below 120. °? Diastolic: below 80. ° °The type of hypertension that you are diagnosed with depends on your test results and when your symptoms developed. °· Chronic hypertension is usually diagnosed before 20 weeks of pregnancy. °· Gestational hypertension is usually diagnosed after 20 weeks of pregnancy. °· Hypertension with high amounts of protein in the urine is diagnosed as preeclampsia. °· Blood pressure measurements that stay above 160 systolic, or above 110 diastolic, are   signs of severe preeclampsia. ° °How is this treated? °Treatment for hypertension during pregnancy varies depending on the type of hypertension you have and how  serious it is. °· If you take medicines called ACE inhibitors to treat chronic hypertension, you may need to switch medicines. ACE inhibitors should not be taken during pregnancy. °· If you have gestational hypertension, you may need to take blood pressure medicine. °· If you are at risk for preeclampsia, your health care provider may recommend that you take a low-dose aspirin every day to prevent high blood pressure during your pregnancy. °· If you have severe preeclampsia, you may need to be hospitalized so you and your baby can be monitored closely. You may also need to take medicine (magnesium sulfate) to prevent seizures and to lower blood pressure. This medicine may be given as an injection or through an IV tube. °· In some cases, if your condition gets worse, you may need to deliver your baby early. ° °Follow these instructions at home: °Eating and drinking °· Drink enough fluid to keep your urine clear or pale yellow. °· Eat a healthy diet that is low in salt (sodium). Do not add salt to your food. Check food labels to see how much sodium a food or beverage contains. °Lifestyle °· Do not use any products that contain nicotine or tobacco, such as cigarettes and e-cigarettes. If you need help quitting, ask your health care provider. °· Do not use alcohol. °· Avoid caffeine. °· Avoid stress as much as possible. Rest and get plenty of sleep. °General instructions °· Take over-the-counter and prescription medicines only as told by your health care provider. °· While lying down, lie on your left side. This keeps pressure off your baby. °· While sitting or lying down, raise (elevate) your feet. Try putting some pillows under your lower legs. °· Exercise regularly. Ask your health care provider what kinds of exercise are best for you. °· Keep all prenatal and follow-up visits as told by your health care provider. This is important. °Contact a health care provider if: °· You have symptoms that your health care  provider told you may require more treatment or monitoring, such as: °? Fever. °? Vomiting. °? Headache. °Get help right away if: °· You have severe abdominal pain or vomiting that does not get better with treatment. °· You suddenly develop swelling in your hands, ankles, or face. °· You gain 4 lbs (1.8 kg) or more in 1 week. °· You develop vaginal bleeding, or you have blood in your urine. °· You do not feel your baby moving as much as usual. °· You have blurred or double vision. °· You have muscle twitching or sudden tightening (spasms). °· You have shortness of breath. °· Your lips or fingernails turn blue. °This information is not intended to replace advice given to you by your health care provider. Make sure you discuss any questions you have with your health care provider. °Document Released: 04/28/2011 Document Revised: 02/28/2016 Document Reviewed: 01/24/2016 °[employer]vier Interactive Patient Education © 2018 [employer] ° °

## 2017-03-21 ENCOUNTER — Telehealth: Payer: Self-pay | Admitting: Obstetrics and Gynecology

## 2017-03-21 NOTE — Telephone Encounter (Signed)
Pt reports her BP in 130/80-90 on self check.  Still slight vision blurriness, improving but slowly. Pt to call if not resolved in 5 d.

## 2017-03-21 NOTE — Telephone Encounter (Signed)
No answer

## 2017-03-22 ENCOUNTER — Encounter: Payer: Self-pay | Admitting: Obstetrics & Gynecology

## 2017-03-23 ENCOUNTER — Telehealth: Payer: Self-pay

## 2017-03-23 ENCOUNTER — Ambulatory Visit: Payer: Commercial Managed Care - PPO

## 2017-03-23 VITALS — BP 116/76

## 2017-03-23 DIAGNOSIS — Z0130 Encounter for examination of blood pressure without abnormal findings: Secondary | ICD-10-CM

## 2017-03-23 DIAGNOSIS — Z5189 Encounter for other specified aftercare: Secondary | ICD-10-CM

## 2017-03-23 NOTE — Progress Notes (Signed)
Subjective:  Misty Mckenzie is a 26 y.o. female presents to office for incision check and BP check. C/S 03/17/17 d/t severe preeclampsia.  Current Outpatient Prescriptions  Medication Sig Dispense Refill  . amLODipine (NORVASC) 10 MG tablet Take 1 tablet (10 mg total) by mouth daily. 30 tablet 1  . hydrochlorothiazide (HYDRODIURIL) 25 MG tablet Take 1 tablet (25 mg total) by mouth daily. 10 tablet 0  . ibuprofen (ADVIL,MOTRIN) 600 MG tablet Take 1 tablet (600 mg total) by mouth every 6 (six) hours. 30 tablet 0  . labetalol (NORMODYNE) 200 MG tablet Take 2 tablets (400 mg total) by mouth 2 (two) times daily. 60 tablet 0  . oxyCODONE-acetaminophen (PERCOCET/ROXICET) 5-325 MG tablet Take 1 tablet by mouth every 4 (four) hours as needed (pain scale 4-7). 30 tablet 0  . Prenatal-DSS-FeCb-FeGl-FA (CITRANATAL BLOOM) 90-1 MG TABS Take 1 tablet by mouth daily. 30 tablet 11   No current facility-administered medications for this visit.     Hypertension ROS: Taking medications as instructed, medication side effects include: visual disturbances and feeling weak. Declines TIA's, no chest pain, no dyspnea on exertion and mild swelling of ankles.   New concerns: Pt feels weak and c/o visual disturbances after taking BP meds.  Objective:  BP 116/76   Appearance alert, well appearing, and in no distress. General exam BP noted to be well controlled today in office.   Incision: No complaints per pt. Honeycomb intact and dry. Pt aware to remove Honeycomb after one week.    Assessment:   Hypertension well controlled.   Plan:  Consulted with Dr. Alysia PennaErvin. Pt to stop Labetalol 800 mg but continue  Amlopidine and HCTZ as directed. Pt to follow up in 1 wk for incision check and repeat BP check. Pt to contact the office if sx's worsen.

## 2017-03-23 NOTE — Telephone Encounter (Signed)
Baby scripts called stating that pt has an elevated bp reading of 127/92. I called pt to discuss any other symptoms. Pt states that she has had blurred vision. States that the bp reading was before she took her bp medication. Pt advised to monitor symptoms. [facility] nurse is scheduled for a visit today at 11 am. She will call us back if any changes after that visit.

## 2017-03-30 ENCOUNTER — Ambulatory Visit: Payer: Commercial Managed Care - PPO | Admitting: Certified Nurse Midwife

## 2017-03-30 VITALS — BP 129/86 | HR 98

## 2017-03-30 DIAGNOSIS — Z0130 Encounter for examination of blood pressure without abnormal findings: Secondary | ICD-10-CM

## 2017-03-30 NOTE — Progress Notes (Signed)
Pt states that she has been monitoring her blood pressures at home. She has been getting normal readings, after taking bp medication. Pt incisions looks good. No drainage, no openings, no odor, and no pain, just tenderness.

## 2017-04-15 ENCOUNTER — Ambulatory Visit (INDEPENDENT_AMBULATORY_CARE_PROVIDER_SITE_OTHER): Payer: Commercial Managed Care - PPO | Admitting: Obstetrics

## 2017-04-15 ENCOUNTER — Ambulatory Visit (INDEPENDENT_AMBULATORY_CARE_PROVIDER_SITE_OTHER): Payer: Commercial Managed Care - PPO | Admitting: Obstetrics and Gynecology

## 2017-04-15 ENCOUNTER — Encounter: Payer: Self-pay | Admitting: Obstetrics and Gynecology

## 2017-04-15 MED ORDER — NORETHINDRONE 0.35 MG PO TABS: 1.0000 | ORAL_TABLET | Freq: Every day | ORAL | 11 refills | Status: AC

## 2017-04-15 NOTE — Progress Notes (Signed)
Patient reports that she is thinking about BC pills for contraception, reports that baby is in NICU and is currently doing better. Pt complains that she had pre-eclampsia and her vision in her left eye has not returned to normal yet.   Marland KitchenArlie Solomons Partum Mckenzie Bali Mckenzie is a 26 y.o. G2P0110 female who presents for a postpartum visit. She is 4 weeks postpartum following a low cervical transverse Cesarean section d/t severe PEC. Has been on BP meds postpartum. Currently only taking Norvasc. She denies any HA. I have fully reviewed the prenatal and intrapartum course. The delivery was at 31.6 gestational weeks.  Anesthesia: spinal. Postpartum course has been good. Baby's course has been complicated by admission to NICU. However mother states doing well and hopes home soon.Misty Mckenzie is feeding by both breast and bottle - Similac Advance. Bleeding staining only. Bowel function is normal. Bladder function is normal. Patient is not sexually active. Contraception method is none. Postpartum depression screening:neg  The following portions of the patient's history were reviewed and updated as appropriate: allergies, current medications, past family history, past medical history, past social history and past surgical history.  Review of Systems Eyes: still slight blurry vision in left eye but is improving, right eye normal vision   Objective:     General:  alert   Breasts:  not examined  Lungs: clear to auscultation bilaterally  Heart:  regular rate and rhythm, S1, S2 normal, no murmur, click, rub or gallop  Abdomen: soft, non-tender; bowel sounds normal; no masses,  no organomegaly, incision healing well   Vulva:  not evaluated  Vagina: not evaluated  Cervix:  not evaluated  Corpus: not examined  Adnexa:  not evaluated  Misty Mckenzie: Not performed.        Assessment:    Nl postpartum Mckenzie.   SPEC  Blurry Vision  Plan:   1. Contraception: oral progesterone-only contraceptive. 2. Return to normal  ADL's 3. Follow up in: 1 year or as needed. Pt will complete current Rx of Norvasc. Has 3 days left. Then will stop. F/U in 2 weeks for BP with nurse. Also re evaluate left eye. If has return to normal no further Tx necessary. However if continues with blurry vision. Will refer to Opth.

## 2017-04-15 NOTE — Patient Instructions (Signed)

## 2017-04-27 ENCOUNTER — Ambulatory Visit: Payer: Commercial Managed Care - PPO

## 2017-04-27 VITALS — BP 117/76 | HR 82 | Ht 65.0 in | Wt 103.8 lb

## 2017-04-27 DIAGNOSIS — Z0130 Encounter for examination of blood pressure without abnormal findings: Secondary | ICD-10-CM

## 2017-04-27 NOTE — Progress Notes (Signed)
Patient presents for Bp check. BP117/76 Pulse 82.  She is nolonger taking BP meds.  Vision is still blurry.  Subjective:  Misty Mckenzie is a 26 y.o. female with hypertension. Current Outpatient Prescriptions  Medication Sig Dispense Refill  . amLODipine (NORVASC) 10 MG tablet Take 1 tablet (10 mg total) by mouth daily. 30 tablet 1  . ibuprofen (ADVIL,MOTRIN) 600 MG tablet Take 1 tablet (600 mg total) by mouth every 6 (six) hours. 30 tablet 0  . norethindrone (MICRONOR,CAMILA,ERRIN) 0.35 MG tablet Take 1 tablet (0.35 mg total) by mouth daily. 1 Package 11  . oxyCODONE-acetaminophen (PERCOCET/ROXICET) 5-325 MG tablet Take 1 tablet by mouth every 4 (four) hours as needed (pain scale 4-7). 30 tablet 0  . Prenatal-DSS-FeCb-FeGl-FA (CITRANATAL BLOOM) 90-1 MG TABS Take 1 tablet by mouth daily. 30 tablet 11   No current facility-administered medications for this visit.     Hypertension ROS: taking medications as instructed, not taking medications regularly as instructed, no medication side effects noted, no TIA's, no chest pain on exertion, no dyspnea on exertion and no swelling of ankles.  New concerns: Vision is still blurry. Needs referral to eye doctor.   Objective:   Appearance alert, well appearing, and in no distress. General exam BP noted to be well controlled today in office.  No need to continue taking Bp Meds per Dr. Debroah LoopArnold.    Assessment:   Hypertension well controlled.   Plan:  Current treatment plan is effective, no change in therapy.. See PC for blurry vision.

## 2017-04-27 NOTE — Addendum Note (Signed)
Addended by: Maretta BeesMCGLASHAN, Alanny Rivers J on: 04/27/2017 11:34 AM   Modules accepted: Level of Service

## 2017-05-03 NOTE — Progress Notes (Signed)
Agree with nursing staff's documentation of this patient's clinic encounter.  Rachelle A Denney, CNM
